# Patient Record
Sex: Male | Born: 1950
Health system: Southern US, Community
[De-identification: ages and names within clinical notes are randomized; demographics above are authoritative.]

## PROBLEM LIST (undated history)

## (undated) DIAGNOSIS — E291 Testicular hypofunction: Secondary | ICD-10-CM

## (undated) DIAGNOSIS — R000 Tachycardia, unspecified: Secondary | ICD-10-CM

## (undated) DIAGNOSIS — I10 Essential (primary) hypertension: Secondary | ICD-10-CM

## (undated) DIAGNOSIS — N529 Male erectile dysfunction, unspecified: Secondary | ICD-10-CM

## (undated) DIAGNOSIS — N400 Enlarged prostate without lower urinary tract symptoms: Secondary | ICD-10-CM

## (undated) DIAGNOSIS — R7301 Impaired fasting glucose: Secondary | ICD-10-CM

## (undated) DIAGNOSIS — H353190 Nonexudative age-related macular degeneration, unspecified eye, stage unspecified: Secondary | ICD-10-CM

## (undated) HISTORY — DX: Testicular hypofunction: E29.1

## (undated) HISTORY — DX: Male erectile dysfunction, unspecified: N52.9

## (undated) HISTORY — DX: Benign prostatic hyperplasia without lower urinary tract symptoms: N40.0

## (undated) HISTORY — DX: Essential (primary) hypertension: I10

## (undated) HISTORY — PX: ANKLE SURGERY: SHX546

## (undated) HISTORY — PX: FOOT SURGERY: SHX648

## (undated) HISTORY — DX: Impaired fasting glucose: R73.01

## (undated) HISTORY — DX: Nonexudative age-related macular degeneration, unspecified eye, stage unspecified: H35.3190

## (undated) HISTORY — DX: Tachycardia, unspecified: R00.0

## (undated) HISTORY — PX: TONSILLECTOMY: SHX5217

## (undated) HISTORY — PX: VASECTOMY: SHX75

---

## 1999-11-27 HISTORY — PX: ARTHROSCOPIC REPAIR ACL: SUR80

## 2004-11-26 HISTORY — PX: ABLATION OF DYSRHYTHMIC FOCUS: SHX254

## 2007-11-27 LAB — HM COLONOSCOPY

## 2009-11-26 DIAGNOSIS — H353190 Nonexudative age-related macular degeneration, unspecified eye, stage unspecified: Secondary | ICD-10-CM

## 2009-11-26 HISTORY — DX: Nonexudative age-related macular degeneration, unspecified eye, stage unspecified: H35.3190

## 2012-01-02 ENCOUNTER — Ambulatory Visit (INDEPENDENT_AMBULATORY_CARE_PROVIDER_SITE_OTHER): Payer: BC Managed Care – PPO | Admitting: Family Medicine

## 2012-01-02 ENCOUNTER — Encounter: Payer: Self-pay | Admitting: Family Medicine

## 2012-01-02 VITALS — BP 102/74 | HR 60 | Ht 73.0 in | Wt 235.0 lb

## 2012-01-02 DIAGNOSIS — N400 Enlarged prostate without lower urinary tract symptoms: Secondary | ICD-10-CM

## 2012-01-02 DIAGNOSIS — R7301 Impaired fasting glucose: Secondary | ICD-10-CM

## 2012-01-02 DIAGNOSIS — Z125 Encounter for screening for malignant neoplasm of prostate: Secondary | ICD-10-CM

## 2012-01-02 DIAGNOSIS — Z23 Encounter for immunization: Secondary | ICD-10-CM

## 2012-01-02 DIAGNOSIS — R5381 Other malaise: Secondary | ICD-10-CM

## 2012-01-02 DIAGNOSIS — Z0000 Encounter for general adult medical examination without abnormal findings: Secondary | ICD-10-CM

## 2012-01-02 DIAGNOSIS — E785 Hyperlipidemia, unspecified: Secondary | ICD-10-CM

## 2012-01-02 DIAGNOSIS — I10 Essential (primary) hypertension: Secondary | ICD-10-CM

## 2012-01-02 DIAGNOSIS — H353190 Nonexudative age-related macular degeneration, unspecified eye, stage unspecified: Secondary | ICD-10-CM

## 2012-01-02 LAB — POCT URINALYSIS DIPSTICK
Leukocytes, UA: NEGATIVE
Nitrite, UA: NEGATIVE
Protein, UA: NEGATIVE
Spec Grav, UA: 1.02
Urobilinogen, UA: NEGATIVE

## 2012-01-02 MED ORDER — TAMSULOSIN HCL 0.4 MG PO CAPS: 0.4000 mg | ORAL_CAPSULE | Freq: Every day | ORAL | Status: DC

## 2012-01-02 NOTE — Progress Notes (Signed)
Dillon Caldwell is a 61 y.o. male who presents for a complete physical.  He has the following concerns: He would like prescription for Flomax. He took it for a long time, then changed to [place], but has been off since August. Haynes Bast is expensive, and wasn't significantly better than the Flomax.  Currently gets up to void once a night only once a week.  Has occasional urinary hesitancy (took a long time to void giving sample today), and some decreased stream and doesn't feel that bladder emptying is as good as when he was on medications.  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  He was followed by retinal specialist in [place], was due for f/u this past August.  Hypertension follow-up.  Has been on medications for 12 years, and has been on Cozaar.  He was out of medications for about a month and BP remained 120/80, and once 104/68 when he donated blood, off medication. Has been back on Cozaar for about a month. Denies any dizziness, headaches, chest pain.  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  Health Maintenance:  There is no immunization history on file for this patient. Patient can't recall last tetanus shot, has been "many years" Flu shot at grocery store in November 2012 Last colonoscopy: 2008 Last PSA: yearly with previous doctor in [place] Dentist: due--needs name of local dentist Ophtho: regularly Exercise: 4 days/week, and walks dogs 20 minutes daily.  Past Medical History  Diagnosis Date  . Essential hypertension, benign     diagnosed age 30  . BPH (benign prostatic hyperplasia)   . Macular degeneration, dry 2011    followed by retinal specialist  . Tachycardia     likely h/o SVT, treated with ablation   . Erectile dysfunction     previously took Cialis    Past Surgical History  Procedure Date  . Ablation of dysrhythmic focus 2006    ?PSVT by history  . Ankle surgery     both ankles with bone spurs (related to [place])   . Arthroscopic repair acl 2001    L knee  . Vasectomy   . Tonsillectomy child    History   Social History  . Marital Status: Married    Spouse Name: N/A    Number of Children: 2  . Years of Education: N/A   Occupational History  . retired Runner, broadcasting/film/video    Social History Main Topics  . Smoking status: Never Smoker   . Smokeless tobacco: Never Used  . Alcohol Use: Yes     10 beers/week and occasional martini (at least 1 drink daily)  . Drug Use: No  . Sexually Active: Yes -- Male partner(s)   Other Topics Concern  . Not on file   Social History Narrative   Lives with wife, 2 dogs and outside cat.  Son lives in [place], daughter in [place], [place]    Family History  Problem Relation Age of Onset  . Diabetes Mother   . Heart disease Mother     angioplasty in her 82's  . Pulmonary fibrosis Mother   . Hypertension Mother   . Macular degeneration Mother   . Stroke Brother 48  . Crohn's disease Son     dx'd at age 25 months  . Macular degeneration Maternal Aunt   . Diabetes Maternal Aunt   . Macular degeneration Maternal Uncle   . Diabetes Maternal Uncle   . Cancer Maternal Uncle  prostate cancer  . Cancer Maternal Grandmother 8    colon cancer  . Macular degeneration Maternal Aunt   . Diabetes Maternal Aunt    Current outpatient prescriptions:losartan (COZAAR) 100 MG tablet, Take 100 mg by mouth daily., Disp: , Rfl: ;  Multiple Vitamins-Minerals (PRESERVISION AREDS 2 PO), Take 2 capsules by mouth daily., Disp: , Rfl: ;  Tamsulosin HCl (FLOMAX) 0.4 MG CAPS, Take 1 capsule (0.4 mg total) by mouth daily., Disp: 30 capsule, Rfl: 11;  vitamin C (ASCORBIC ACID) 500 MG tablet, Take 500 mg by mouth daily., Disp: , Rfl:   No Known Allergies  ROS: The patient denies anorexia, fever, weight changes, headaches,  vision loss, ear pain, hoarseness, chest pain, palpitations, dizziness, syncope, dyspnea on exertion, cough, swelling, nausea, vomiting, diarrhea, constipation, abdominal  pain, melena, hematochezia, indigestion/heartburn, hematuria, incontinence, dysuria, genital lesions, joint pains, numbness, tingling, weakness, tremor, suspicious skin lesions, depression, anxiety, abnormal bleeding/bruising, or enlarged lymph nodes +hearing loss--wears hearing aids. +erectile dysfunction (not a big issue now due to wife) +Loss of hair on both lower legs   PHYSICAL EXAM: BP 102/74  Pulse 60  Ht 6\' 1"  (1.854 m)  Wt 235 lb (106.595 kg)  BMI 31.00 kg/m2 General Appearance:    Alert, cooperative, no distress, appears stated age  Head:    Normocephalic, without obvious abnormality, atraumatic  Eyes:    PERRL, conjunctiva/corneas clear, EOM's intact, fundi    benign  Ears:    Normal TM's and external ear canals on left.  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, chronic)  Nose:   Nares normal, mucosa normal, no drainage or sinus   tenderness  Throat:   Lips, mucosa, and tongue normal; teeth and gums normal  Neck:   Supple, no lymphadenopathy;  thyroid:  no   enlargement/tenderness/nodules; no carotid   bruit or JVD  Back:    Spine nontender, no curvature, ROM normal, no CVA     tenderness  Lungs:     Clear to auscultation bilaterally without wheezes, rales or     ronchi; respirations unlabored  Chest Wall:    No tenderness or deformity   Heart:    Regular rate and rhythm, S1 and S2 normal, no murmur, rub   or gallop  Breast Exam:    No chest wall tenderness, masses or gynecomastia  Abdomen:     Soft, non-tender, nondistended, normoactive bowel sounds,    no masses, no hepatosplenomegaly  Genitalia:    Normal male external genitalia without lesions.  Testicles without masses.  No inguinal hernias.  Rectal:    Normal sphincter tone, no masses or tenderness; guaiac negative stool.  Prostate smooth, no nodules, mildly enlarged.  Extremities:   No clubbing, cyanosis or edema  Pulses:   2+ and symmetric all extremities  Skin:   Skin color, texture, turgor normal, no  rashes or lesions.  Decreased hair on lower extremities bilaterally  Lymph nodes:   Cervical, supraclavicular, and axillary nodes normal  Neurologic:   CNII-XII intact, normal strength, sensation and gait; reflexes 2+ and symmetric throughout          Psych:   Normal mood, affect, hygiene and grooming.     ASSESSMENT/PLAN: 1. Routine general medical examination at a health care facility  Visual acuity screening, POCT Urinalysis Dipstick  2. Other malaise and fatigue  CBC with Differential, TSH  3. Special screening for malignant neoplasm of prostate  PSA  4. Dyslipidemia  Lipid panel  5. Essential hypertension, benign  Comprehensive metabolic panel  6. Impaired fasting glucose  Hemoglobin A1c  7. BPH (benign prostatic hyperplasia)  Tamsulosin HCl (FLOMAX) 0.4 MG CAPS  8. Need for Tdap vaccination  Tdap vaccine greater than or equal to 7yo IM   HTN--trial cutting Cozaar back to 1/2 tablet, monitor blood pressures at home.  Goal <130/80  BPH--restart Flomax 0.4mg   Macular degeneration--refer to retinal specialist  Discussed PSA screening (risks/benefits) and he wishes to proceed with testing: recommended at least 30 minutes of aerobic activity at least 5 days/week; proper sunscreen use reviewed; healthy diet and alcohol recommendations (less than or equal to 2 drinks/day) reviewed; regular seatbelt use; changing batteries in smoke detectors. Self-testicular exams. Immunization recommendations discussed.  Colonoscopy recommendations reviewed--likely due again this year.  Will review old records.  Check old records for immunizations.  Give tdap today. Zostavax recommended--to check with insurance Review old records to see when next colonoscopy is due--likely this year.

## 2012-01-02 NOTE — Progress Notes (Signed)
Addended by: Joselyn Arrow on: 01/02/2012 08:53 PM   Modules accepted: Orders

## 2012-01-02 NOTE — Patient Instructions (Addendum)
HEALTH MAINTENANCE RECOMMENDATIONS:  It is recommended that you get at least 30 minutes of aerobic exercise at least 5 days/week (for weight loss, you may need as much as 60-90 minutes). This can be any activity that gets your heart rate up. This can be divided in 10-15 minute intervals if needed, but try and build up your endurance at least once a week.  Weight bearing exercise is also recommended twice weekly.  Eat a healthy diet with lots of vegetables, fruits and fiber.  "Colorful" foods have a lot of vitamins (ie green vegetables, tomatoes, red peppers, etc).  Limit sweet tea, regular sodas and alcoholic beverages, all of which has a lot of calories and sugar.  Up to 2 alcoholic drinks daily may be beneficial for men (unless trying to lose weight, watch sugars).  Drink a lot of water.  Sunscreen of at least SPF 30 should be used on all sun-exposed parts of the skin when outside between the hours of 10 am and 4 pm (not just when at beach or pool, but even with exercise, golf, tennis, and yard work!)  Use a sunscreen that says "broad spectrum" so it covers both UVA and UVB rays, and make sure to reapply every 1-2 hours.  Remember to change the batteries in your smoke detectors when changing your clock times in the spring and fall.  Use your seat belt every time you are in a car, and please drive safely and not be distracted with cell phones and texting while driving.   High blood pressure--trial cutting Cozaar back to 1/2 tablet, monitor blood pressures at home.  Goal <130/80. If BP remains at goal, you can call for change in Rx to 50 mg tablet.  BPH--restart Flomax.  Check with your insurance regarding shingles vaccine coverage (Zostavax). Schedule nurse visit to get the vaccine.  Dentists I recommend: Dr. Kathlene November [facility] near [facility]) Dr. Rocco Pauls [facility] near [facility])  Weight loss is encouraged.  Increase your exercise, cut back on carbs, alcohol  and juices or other beverages that contain calories.

## 2012-01-03 ENCOUNTER — Other Ambulatory Visit: Payer: BC Managed Care – PPO

## 2012-01-03 DIAGNOSIS — R5381 Other malaise: Secondary | ICD-10-CM

## 2012-01-03 DIAGNOSIS — Z125 Encounter for screening for malignant neoplasm of prostate: Secondary | ICD-10-CM

## 2012-01-03 DIAGNOSIS — E785 Hyperlipidemia, unspecified: Secondary | ICD-10-CM

## 2012-01-03 DIAGNOSIS — I10 Essential (primary) hypertension: Secondary | ICD-10-CM

## 2012-01-03 DIAGNOSIS — R7301 Impaired fasting glucose: Secondary | ICD-10-CM

## 2012-01-03 LAB — CBC WITH DIFFERENTIAL/PLATELET
Basophils Relative: 1 % (ref 0–1)
Eosinophils Absolute: 0.3 10*3/uL (ref 0.0–0.7)
Lymphs Abs: 1.1 10*3/uL (ref 0.7–4.0)
MCH: 30.4 pg (ref 26.0–34.0)
Neutrophils Relative %: 58 % (ref 43–77)
Platelets: 186 10*3/uL (ref 150–400)
RBC: 4.54 MIL/uL (ref 4.22–5.81)

## 2012-01-03 LAB — COMPREHENSIVE METABOLIC PANEL
ALT: 22 U/L (ref 0–53)
AST: 27 U/L (ref 0–37)
Alkaline Phosphatase: 67 U/L (ref 39–117)
Glucose, Bld: 110 mg/dL — ABNORMAL HIGH (ref 70–99)
Sodium: 137 mEq/L (ref 135–145)
Total Bilirubin: 0.5 mg/dL (ref 0.3–1.2)
Total Protein: 6.3 g/dL (ref 6.0–8.3)

## 2012-01-03 LAB — LIPID PANEL
LDL Cholesterol: 121 mg/dL — ABNORMAL HIGH (ref 0–99)
Triglycerides: 99 mg/dL (ref <150)
VLDL: 20 mg/dL (ref 0–40)

## 2012-01-04 LAB — PSA: PSA: 1.21 ng/mL (ref <=4.00)

## 2012-01-04 LAB — TSH: TSH: 3.431 u[IU]/mL (ref 0.350–4.500)

## 2012-01-04 LAB — HEMOGLOBIN A1C
Hgb A1c MFr Bld: 6 % — ABNORMAL HIGH (ref <5.7)
Mean Plasma Glucose: 126 mg/dL — ABNORMAL HIGH (ref <117)

## 2012-01-14 ENCOUNTER — Encounter: Payer: Self-pay | Admitting: Family Medicine

## 2012-01-14 ENCOUNTER — Ambulatory Visit (INDEPENDENT_AMBULATORY_CARE_PROVIDER_SITE_OTHER): Payer: BC Managed Care – PPO | Admitting: Family Medicine

## 2012-01-14 VITALS — BP 112/74 | HR 84 | Temp 98.8°F | Ht 73.0 in | Wt 234.0 lb

## 2012-01-14 DIAGNOSIS — J069 Acute upper respiratory infection, unspecified: Secondary | ICD-10-CM

## 2012-01-14 NOTE — Progress Notes (Signed)
Chief complaint: deep hacking cough and body aches x 8 days  HPI:  Began 8 days ago with some achiness, chills, then developed cough.  Had some postnasal drip.  Only mild head congestion.  Feels like it is mostly in his chest.  Cough is nonproductive. Denies shortness of breath.  Felt warm at times, and had some chills earlier last week.  Has some intermittent, "fleeting" headaches posteriorly.  Denies sinus headaches.  Slight sore throat, scratchy.  Denies ear pain, pressure.  Took OTC multi-symptoms cough and cold medicine with some improvement.  Symptoms aren't getting worse, just hadn't been getting better.  Today it feels like the stuff in his chest is loosening up a bit, not as dense as it has been.     Past Medical History  Diagnosis Date  . Essential hypertension, benign     diagnosed age 61  . BPH (benign prostatic hyperplasia)   . Macular degeneration, dry 2011    followed by retinal specialist  . Tachycardia     likely h/o SVT, treated with ablation   . Erectile dysfunction     previously took Cialis    Past Surgical History  Procedure Date  . Ablation of dysrhythmic focus 2006    ?PSVT by history  . Ankle surgery     both ankles with bone spurs (related to [place])  . Arthroscopic repair acl 2001    L knee  . Vasectomy   . Tonsillectomy child    History   Social History  . Marital Status: Married    Spouse Name: N/A    Number of Children: 2  . Years of Education: N/A   Occupational History  . retired Runner, broadcasting/film/video    Social History Main Topics  . Smoking status: Never Smoker   . Smokeless tobacco: Never Used  . Alcohol Use: Yes     10 beers/week and occasional martini (at least 1 drink daily)  . Drug Use: No  . Sexually Active: Yes -- Male partner(s)   Other Topics Concern  . Not on file   Social History Narrative   Lives with wife, 2 dogs and outside cat.  Son lives in [place], daughter in [place], [place]    Family History  Problem Relation Age of Onset  .  Diabetes Mother   . Heart disease Mother     angioplasty in her 65's  . Pulmonary fibrosis Mother   . Hypertension Mother   . Macular degeneration Mother   . Stroke Brother 48  . Crohn's disease Son     dx'd at age 98 months  . Macular degeneration Maternal Aunt   . Diabetes Maternal Aunt   . Macular degeneration Maternal Uncle   . Diabetes Maternal Uncle   . Cancer Maternal Uncle     prostate cancer  . Cancer Maternal Grandmother 52    colon cancer  . Macular degeneration Maternal Aunt   . Diabetes Maternal Aunt     Current outpatient prescriptions:acetaminophen (TYLENOL) 500 MG tablet, Take 1,000 mg by mouth 2 (two) times daily., Disp: , Rfl: ;  losartan (COZAAR) 100 MG tablet, Take 100 mg by mouth daily., Disp: , Rfl: ;  Multiple Vitamins-Minerals (PRESERVISION AREDS 2 PO), Take 2 capsules by mouth daily., Disp: , Rfl: ;  Omega-3 Fatty Acids (FISH OIL) 1000 MG CAPS, Take 1 capsule by mouth daily., Disp: , Rfl:  Tamsulosin HCl (FLOMAX) 0.4 MG CAPS, Take 1 capsule (0.4 mg total) by mouth daily., Disp: 30 capsule, Rfl: 11;  vitamin C (ASCORBIC ACID) 500 MG tablet, Take 500 mg by mouth daily., Disp: , Rfl:   No Known Allergies  ROS:  Denies headaches, chest pain, palpitations, shortness of breath, GI or GU complaints, skin rash.  See HPI  PHYSICAL EXAM: BP 112/74  Pulse 84  Temp(Src) 98.8 F (37.1 C) (Oral)  Ht 6\' 1"  (1.854 m)  Wt 234 lb (106.142 kg)  BMI 30.87 kg/m2  Well developed, pleasant male, in no distress, occasional cough. HEENT:  PERRL, EOMI, conjunctiva clear.  TM's and EAC's normal, wearing hearing aids.  Nasal mucosa is mild-mod edematous, nonerythematous.  Some yellow crust L nares, clear on R.  Sinuses nontender.  OP is clear except for some cobblestoning posteriorly. Neck: no lymphadenopathy Heart: regular rate and rhythm without murmur Lungs: clear bilaterally with good air movement.  No wheezes, rales or ronchi Skin: no rashes Psych: normal mood, affect,  hygiene and grooming  ASSESSMENT/PLAN: 1. URI (upper respiratory infection)     Supportive measures were reviewed.  Recommended antihistamine (claritin, zyrtec or coricidin HBP); avoid decongestants. Guaifenesin is recommended to loosen phlegm (ie Mucinex plain or DM, or Robitussin products)  Call if symptoms persist or worsen over the next week for antibiotics

## 2012-01-14 NOTE — Patient Instructions (Signed)
Upper respiratory infection--Supportive measures were reviewed.  Recommended antihistamine (claritin, zyrtec or coricidin HBP); avoid decongestants. Guaifenesin is recommended to loosen phlegm (ie Mucinex plain or DM, or Robitussin products)  Call if symptoms persist or worsen over the next week for antibiotics

## 2012-01-16 ENCOUNTER — Encounter (INDEPENDENT_AMBULATORY_CARE_PROVIDER_SITE_OTHER): Payer: BC Managed Care – PPO | Admitting: Ophthalmology

## 2012-01-16 ENCOUNTER — Telehealth: Payer: Self-pay | Admitting: Family Medicine

## 2012-01-16 DIAGNOSIS — H3530 Unspecified macular degeneration: Secondary | ICD-10-CM

## 2012-01-16 DIAGNOSIS — H3553 Other dystrophies primarily involving the sensory retina: Secondary | ICD-10-CM

## 2012-01-16 DIAGNOSIS — H2510 Age-related nuclear cataract, unspecified eye: Secondary | ICD-10-CM

## 2012-01-16 DIAGNOSIS — H43819 Vitreous degeneration, unspecified eye: Secondary | ICD-10-CM

## 2012-01-16 MED ORDER — AZITHROMYCIN 250 MG PO TABS: ORAL_TABLET | ORAL | Status: AC

## 2012-01-16 NOTE — Telephone Encounter (Signed)
Called patient and let him know rx for zpack sent to pharmacy.

## 2012-01-16 NOTE — Telephone Encounter (Signed)
Advise pt--z-pak sent to pharmacy 

## 2012-09-08 ENCOUNTER — Encounter: Payer: Self-pay | Admitting: Family Medicine

## 2012-09-08 ENCOUNTER — Ambulatory Visit (INDEPENDENT_AMBULATORY_CARE_PROVIDER_SITE_OTHER): Payer: BC Managed Care – PPO | Admitting: Family Medicine

## 2012-09-08 VITALS — BP 104/70 | HR 60 | Ht 73.0 in | Wt 220.0 lb

## 2012-09-08 DIAGNOSIS — R7301 Impaired fasting glucose: Secondary | ICD-10-CM

## 2012-09-08 DIAGNOSIS — I10 Essential (primary) hypertension: Secondary | ICD-10-CM

## 2012-09-08 DIAGNOSIS — Z23 Encounter for immunization: Secondary | ICD-10-CM

## 2012-09-08 DIAGNOSIS — E785 Hyperlipidemia, unspecified: Secondary | ICD-10-CM

## 2012-09-08 LAB — POCT GLYCOSYLATED HEMOGLOBIN (HGB A1C): Hemoglobin A1C: 5.7

## 2012-09-08 NOTE — Progress Notes (Signed)
Chief Complaint  Patient presents with  . Follow-up    fasting follow up. Would like Zostavax-was partially counseled at [facility], thinks his insurance will pay, did call and after 30 minutes of getting nowhere he hung up.   HPI:  Patient presents for f/u on glucose, cholesterol, and blood pressure.  He lost 14 pounds since his last visit in February.  He has been exercising longer (43 minutes on treadmill, instead of 20), and eating [employer] with additional vegetables at least 5x/week for dinner, burrito bowls at [employer] for lunch.  HTN--has been off of cozaar for about 2 weeks (ran out).  Denies having any dizziness prior to stopping. Denies headaches.  Has lost weight, and BP is still good--wondering if he needs to restart medication or not.  Zostavax--his son will be staying with him for a month, coming soon, on Rem[employer] for Crohn's.  Prefers to wait until after son leaves to return for vaccine.  Past Medical History  Diagnosis Date  . Essential hypertension, benign     diagnosed age 49  . BPH (benign prostatic hyperplasia)   . Macular degeneration, dry 2011    followed by retinal specialist  . Tachycardia     likely h/o SVT, treated with ablation   . Erectile dysfunction     previously took Cialis   Past Surgical History  Procedure Date  . Ablation of dysrhythmic focus 2006    ?PSVT by history  . Ankle surgery     both ankles with bone spurs (related to [place])  . Arthroscopic repair acl 2001    L knee  . Vasectomy   . Tonsillectomy child   History   Social History  . Marital Status: Married    Spouse Name: N/A    Number of Children: 2  . Years of Education: N/A   Occupational History  . retired Runner, broadcasting/film/video    Social History Main Topics  . Smoking status: Never Smoker   . Smokeless tobacco: Never Used  . Alcohol Use: Yes     maybe 3 beers per week.  . Drug Use: No  . Sexually Active: Yes -- Male partner(s)   Other Topics Concern  . Not on file   Social History  Narrative   Lives with wife, 2 dogs and outside cat.  Son lives in [place], daughter in [place], teaching at [employer].   Current outpatient prescriptions:acetaminophen (TYLENOL) 500 MG tablet, Take 1,000 mg by mouth at bedtime. , Disp: , Rfl: ;  Multiple Vitamins-Minerals (PRESERVISION AREDS 2 PO), Take 2 capsules by mouth daily., Disp: , Rfl: ;  Omega-3 Fatty Acids (FISH OIL) 1000 MG CAPS, Take 1 capsule by mouth daily., Disp: , Rfl: ;  Tamsulosin HCl (FLOMAX) 0.4 MG CAPS, Take 1 capsule (0.4 mg total) by mouth daily., Disp: 30 capsule, Rfl: 11 vitamin C (ASCORBIC ACID) 500 MG tablet, Take 500 mg by mouth daily., Disp: , Rfl: ;  losartan (COZAAR) 100 MG tablet, Take 100 mg by mouth daily., Disp: , Rfl:   No Known Allergies  ROS:  Denies headaches, dizziness, chest pain, palpitations, shortness of breath, fevers, URI symptoms, GI complaints, skin rash or other concerns.  Denies edema.  PHYSICAL EXAM: BP 104/70  Pulse 60  Ht 6\' 1"  (1.854 m)  Wt 220 lb (99.791 kg)  BMI 29.03 kg/m2 112/68 on repeat by MD, RA Well developed, pleasant male in no distress Neck: no lymphadenopathy or mass Heart: regular rate and rhythm Lungs: clear bilaterally Abdomen: soft, nontender, no mass  Extremities: no edema Psych: normal mood, affect, hygiene and grooming Neuro: alert and oriented. Normal gait, cranial nerves grossly intact  Lab Results  Component Value Date   HGBA1C 5.7 09/08/2012   ASSESSMENT/PLAN:  1. Dyslipidemia  Lipid panel  2. Need for prophylactic vaccination and inoculation against influenza  Flu vaccine greater than or equal to 3yo preservative free IM  3. Impaired fasting glucose  HgB A1c, Glucose, random  4. Essential hypertension, benign     HTN--okay to remain off Cozaar.  Check BP at least once a week.  If BP>135/85, then restart cozaar at 1/2 tablet daily.  Impaired fasting glucose--A1c improved.  Continue weight loss, dietary changes, and daily exercise  Dyslipidemia--low HDL,  elevated ratio, and borderline LDL.  Recheck today.  Consider doing [employer] in future  Return for nurse visit for zostavax after his son leaves.  Flu shot given today.

## 2012-09-08 NOTE — Patient Instructions (Addendum)
okay to remain off Cozaar.  Check BP at least once a week.  If BP>135/85, then restart cozaar at 1/2 tablet daily.  Continue weight loss, dietary changes, and daily exercise

## 2012-09-09 LAB — LIPID PANEL
Total CHOL/HDL Ratio: 5.3 Ratio
VLDL: 19 mg/dL (ref 0–40)

## 2012-09-09 LAB — GLUCOSE, RANDOM: Glucose, Bld: 111 mg/dL — ABNORMAL HIGH (ref 70–99)

## 2012-12-01 ENCOUNTER — Encounter: Payer: Self-pay | Admitting: Family Medicine

## 2012-12-01 ENCOUNTER — Ambulatory Visit (INDEPENDENT_AMBULATORY_CARE_PROVIDER_SITE_OTHER): Payer: 59 | Admitting: Family Medicine

## 2012-12-01 VITALS — BP 128/82 | HR 56 | Ht 73.0 in | Wt 228.0 lb

## 2012-12-01 DIAGNOSIS — Z125 Encounter for screening for malignant neoplasm of prostate: Secondary | ICD-10-CM

## 2012-12-01 DIAGNOSIS — E785 Hyperlipidemia, unspecified: Secondary | ICD-10-CM

## 2012-12-01 DIAGNOSIS — I10 Essential (primary) hypertension: Secondary | ICD-10-CM

## 2012-12-01 DIAGNOSIS — Z23 Encounter for immunization: Secondary | ICD-10-CM

## 2012-12-01 DIAGNOSIS — Z0000 Encounter for general adult medical examination without abnormal findings: Secondary | ICD-10-CM

## 2012-12-01 DIAGNOSIS — N400 Enlarged prostate without lower urinary tract symptoms: Secondary | ICD-10-CM

## 2012-12-01 DIAGNOSIS — N529 Male erectile dysfunction, unspecified: Secondary | ICD-10-CM

## 2012-12-01 LAB — POCT URINALYSIS DIPSTICK
Nitrite, UA: NEGATIVE
Urobilinogen, UA: NEGATIVE
pH, UA: 5

## 2012-12-01 MED ORDER — TADALAFIL 20 MG PO TABS: 10.0000 mg | ORAL_TABLET | ORAL | Status: DC | PRN

## 2012-12-01 MED ORDER — TAMSULOSIN HCL 0.4 MG PO CAPS: 0.4000 mg | ORAL_CAPSULE | Freq: Every day | ORAL | Status: DC

## 2012-12-01 NOTE — Progress Notes (Signed)
Chief Complaint  Patient presents with  . Annual Exam    nonfasting (had Cliff bar at 7:30am banana and 2 pieces of raisin toast at noon)  UA showed trace blood, pt stated that he is asymptomatic. He does think he may have a bone spur in his right ankle, thinks he may need new orthodics.   Dillon Caldwell is a 62 y.o. male who presents for a complete physical.  He has the following concerns:  He stopped taking BP meds in September or October, and 4 weeks later BP remained low at 100/60 so he remained off medication.  He hasn't checked his blood pressure since then.  Denies headaches, dizziness, chest pain.  BPH:  Doing well on Flomax--stream remains stronger, and only occasionally gets up at night.  Needs refill today.  Dyslipidemia--started taking a no-flush niacin, and increased fish oil dose.  Hasn't return for [facility] study to evaluate particle size yet. He reports always having low HDL, even when very active, running marathons.  R lateral ankle pain--shooting pains intermittently with certain steps, better just a few steps later.  Has h/o bone spurs.  Former Radio producer.  Had surgery to remove bone spur from R, and filed down on the right.  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  Pain occurs only once every 3 months.  Complaining of some right shoulder pain--feels it popping.  Sometimes feels like it is going to "pop out of joint"; pops and painful when he rolls on it at night sometimes.  H/o injury in past (played rugby).  Health Maintenance: Immunization History  Administered Date(s) Administered  . Influenza Split 09/08/2012  . Tdap 01/02/2012   Last colonoscopy: 2008, believes he was told every 10 years (has had 2 colonoscopies, thinking he started age 39-46) Last PSA: 12/2011 Dentist: Dr. Helmut Muster, UTD Ophtho: March 2013 Exercise: 3-4 days/week, and walks dogs 40 minutes daily, pilates 2x/week.  Past Medical History  Diagnosis Date  . Essential hypertension, benign     diagnosed age  55  . BPH (benign prostatic hyperplasia)   . Macular degeneration, dry 2011    followed by retinal specialist (dry)  . Tachycardia     likely h/o SVT, treated with ablation   . Erectile dysfunction     previously took Cialis    Past Surgical History  Procedure Date  . Ablation of dysrhythmic focus 2006    ?PSVT by history  . Ankle surgery     both ankles with bone spurs (related to [place])  . Arthroscopic repair acl 2001    L knee  . Vasectomy   . Tonsillectomy child    History   Social History  . Marital Status: Married    Spouse Name: N/A    Number of Children: 2  . Years of Education: N/A   Occupational History  . retired Runner, broadcasting/film/video    Social History Main Topics  . Smoking status: Never Smoker   . Smokeless tobacco: Never Used  . Alcohol Use: Yes     Comment: maybe 4 per week.  . Drug Use: No  . Sexually Active: Yes -- Male partner(s)   Other Topics Concern  . Not on file   Social History Narrative   Lives with wife, 2 dogs and outside cat.  Son lives in [place], daughter in [place], teaching at [employer]. 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 Diabetes Mother   .  Heart disease Mother     angioplasty in her 42's  . Pulmonary fibrosis Mother   . Hypertension Mother   . Macular degeneration Mother   . Stroke Brother 48  . Crohn's disease Son     dx'd at age 73 months  . Macular degeneration Maternal Aunt   . Diabetes Maternal Aunt   . Macular degeneration Maternal Uncle   . Diabetes Maternal Uncle   . Cancer Maternal Uncle     prostate cancer  . Cancer Maternal Grandmother 42    colon cancer  . Macular degeneration Maternal Aunt   . Diabetes Maternal Aunt     Current outpatient prescriptions:Ibuprofen-Diphenhydramine Cit (ADVIL PM PO), Take 2 tablets by mouth at bedtime as needed., Disp: , Rfl: ;  Multiple Vitamins-Minerals (PRESERVISION AREDS 2 PO), Take 2 capsules by  mouth daily., Disp: , Rfl: ;  niacin 500 MG tablet, Take 500 mg by mouth at bedtime., Disp: , Rfl: ;  Omega-3 Fatty Acids (FISH OIL) 1000 MG CAPS, Take 1 capsule by mouth daily., Disp: , Rfl:  Tamsulosin HCl (FLOMAX) 0.4 MG CAPS, Take 1 capsule (0.4 mg total) by mouth daily., Disp: 30 capsule, Rfl: 11;  vitamin C (ASCORBIC ACID) 500 MG tablet, Take 500 mg by mouth daily., Disp: , Rfl: ;  losartan (COZAAR) 100 MG tablet, Take 100 mg by mouth daily., Disp: , Rfl: ;  tadalafil (CIALIS) 20 MG tablet, Take 0.5-1 tablets (10-20 mg total) by mouth every other day as needed for erectile dysfunction., Disp: 15 tablet, Rfl: 11  No Known Allergies  ROS: The patient denies anorexia, fever, weight changes, headaches, vision loss, ear pain, hoarseness, chest pain, dizziness, syncope, dyspnea on exertion, cough, swelling, nausea, vomiting, diarrhea, constipation, abdominal pain, melena, hematochezia, indigestion/heartburn, hematuria, incontinence, dysuria, genital lesions, joint pains, numbness, tingling, weakness, tremor, suspicious skin lesions, depression, anxiety, abnormal bleeding/bruising, or enlarged lymph nodes. Rare palpitations (noted 3 hard beats last night). +hearing loss--wears hearing aids. Tinnitus bilaterally (chronic) +erectile dysfunction.  Tried cialis in the past, only slight improvement +Loss of hair on both lower legs, although notes that his knee hair has gotten "furrier" R shoulder pain per HPI, ankle pain per HPI  PHYSICAL EXAM: BP 130/90  Pulse 56  Ht 6\' 1"  (1.854 m)  Wt 228 lb (103.42 kg)  BMI 30.08 kg/m2 128/82 on repeat by MD, RA General Appearance:  Alert, cooperative, no distress, appears stated age   Head:  Normocephalic, without obvious abnormality, atraumatic   Eyes:  PERRL, conjunctiva/corneas clear, EOM's intact, fundi  benign   Ears:  Wearing hearing aids bilaterally  Nose:  Nares normal, mucosa normal, no drainage or sinus tenderness   Throat:  Lips, mucosa, and tongue  normal; teeth and gums normal   Neck:  Supple, no lymphadenopathy; thyroid: no enlargement/tenderness/nodules; no carotid  bruit or JVD   Back:  Spine nontender, no curvature, ROM normal, no CVA tenderness   Lungs:  Clear to auscultation bilaterally without wheezes, rales or ronchi; respirations unlabored   Chest Wall:  No tenderness or deformity   Heart:  Regular rate and rhythm, S1 and S2 normal, no murmur, rub  or gallop   Breast Exam:  No chest wall tenderness, masses or gynecomastia   Abdomen:  Soft, non-tender, nondistended, normoactive bowel sounds,  no masses, no hepatosplenomegaly   Genitalia:  Normal male external genitalia without lesions. Testicles without masses. No inguinal hernias.   Rectal:  Normal sphincter tone, no masses or tenderness; guaiac negative stool. Prostate smooth,  no nodules, not enlarged.   Extremities:  No clubbing, cyanosis or edema.  No ankle effusion, warmth.  Crepitus at R shoulder, with "popping" sound noted when testing supraspinatous against resistance.  No pain with ROM, strength normal  Pulses:  2+ and symmetric all extremities   Skin:  Skin color, texture, turgor normal, no rashes or lesions. Decreased hair on lower extremities bilaterally   Lymph nodes:  Cervical, supraclavicular, and axillary nodes normal   Neurologic:  CNII-XII intact, normal strength, sensation and gait; reflexes 2+ and symmetric throughout   Psych: Normal mood, affect, hygiene and grooming.   ASSESSMENT/PLAN:  1. Routine general medical examination at a health care facility  POCT Urinalysis Dipstick, Visual acuity screening  2. Essential hypertension, benign  Comprehensive metabolic panel  3. Special screening for malignant neoplasm of prostate  PSA  4. BPH (benign prostatic hyperplasia)  Tamsulosin HCl (FLOMAX) 0.4 MG CAPS  5. Dyslipidemia    6. Erectile dysfunction  tadalafil (CIALIS) 20 MG tablet  7. Need for shingles vaccine  Varicella-zoster vaccine subcutaneous   H/o  HTN--BP remains okay, okay to remain off medication, periodically check BP's at home.  Reviewed proper technique with wrist cuff, and can bring to next visit if he wants it verified.  PSA and c-met today (nonfasting, recently had lipids and glucose) (4-5 hours postprandial)  Return for [facility] Study to further evaluate dyslipidemia, particle size/number  zostavax today--risks side effects reviewed.  He hasn't checked coverage with his new insurance, but is willing to pay cost if not covered.  Recommended [facility] for new orthotics (and can evaluate ankle pain, x-ray if needed vs refer to ortho)  Shoulder pain--reviewed possible causes.  Consider week trial of NSAID if pain persists.

## 2012-12-01 NOTE — Patient Instructions (Signed)
HEALTH MAINTENANCE RECOMMENDATIONS:  It is recommended that you get at least 30 minutes of aerobic exercise at least 5 days/week (for weight loss, you may need as much as 60-90 minutes). This can be any activity that gets your heart rate up. This can be divided in 10-15 minute intervals if needed, but try and build up your endurance at least once a week.  Weight bearing exercise is also recommended twice weekly.  Eat a healthy diet with lots of vegetables, fruits and fiber.  "Colorful" foods have a lot of vitamins (ie green vegetables, tomatoes, red peppers, etc).  Limit sweet tea, regular sodas and alcoholic beverages, all of which has a lot of calories and sugar.  Up to 2 alcoholic drinks daily may be beneficial for men (unless trying to lose weight, watch sugars).  Drink a lot of water.  Sunscreen of at least SPF 30 should be used on all sun-exposed parts of the skin when outside between the hours of 10 am and 4 pm (not just when at beach or pool, but even with exercise, golf, tennis, and yard work!)  Use a sunscreen that says "broad spectrum" so it covers both UVA and UVB rays, and make sure to reapply every 1-2 hours.  Remember to change the batteries in your smoke detectors when changing your clock times in the spring and fall.  Use your seat belt every time you are in a car, and please drive safely and not be distracted with cell phones and texting while driving.  For new orthotics, I recommend [facility] (Dr. Charlsie Merles, [place])

## 2012-12-02 ENCOUNTER — Encounter: Payer: Self-pay | Admitting: Family Medicine

## 2012-12-02 LAB — COMPREHENSIVE METABOLIC PANEL
AST: 23 U/L (ref 0–37)
Alkaline Phosphatase: 84 U/L (ref 39–117)
BUN: 18 mg/dL (ref 6–23)
Creat: 0.91 mg/dL (ref 0.50–1.35)
Total Bilirubin: 0.6 mg/dL (ref 0.3–1.2)

## 2012-12-02 LAB — PSA: PSA: 1.36 ng/mL (ref <=4.00)

## 2013-01-15 ENCOUNTER — Ambulatory Visit (INDEPENDENT_AMBULATORY_CARE_PROVIDER_SITE_OTHER): Payer: BC Managed Care – PPO | Admitting: Ophthalmology

## 2013-01-29 ENCOUNTER — Ambulatory Visit (INDEPENDENT_AMBULATORY_CARE_PROVIDER_SITE_OTHER): Payer: 59 | Admitting: Ophthalmology

## 2013-01-29 DIAGNOSIS — H2510 Age-related nuclear cataract, unspecified eye: Secondary | ICD-10-CM

## 2013-01-29 DIAGNOSIS — H3530 Unspecified macular degeneration: Secondary | ICD-10-CM

## 2013-01-29 DIAGNOSIS — H43819 Vitreous degeneration, unspecified eye: Secondary | ICD-10-CM

## 2013-03-02 ENCOUNTER — Encounter: Payer: Self-pay | Admitting: Family Medicine

## 2013-03-03 ENCOUNTER — Other Ambulatory Visit: Payer: 59

## 2013-03-09 ENCOUNTER — Ambulatory Visit (INDEPENDENT_AMBULATORY_CARE_PROVIDER_SITE_OTHER): Payer: 59 | Admitting: Family Medicine

## 2013-03-09 ENCOUNTER — Encounter: Payer: Self-pay | Admitting: Family Medicine

## 2013-03-09 VITALS — BP 124/80 | HR 56 | Ht 73.0 in | Wt 233.0 lb

## 2013-03-09 DIAGNOSIS — K219 Gastro-esophageal reflux disease without esophagitis: Secondary | ICD-10-CM

## 2013-03-09 DIAGNOSIS — E785 Hyperlipidemia, unspecified: Secondary | ICD-10-CM

## 2013-03-09 DIAGNOSIS — I10 Essential (primary) hypertension: Secondary | ICD-10-CM | POA: Insufficient documentation

## 2013-03-09 DIAGNOSIS — R079 Chest pain, unspecified: Secondary | ICD-10-CM

## 2013-03-09 NOTE — Progress Notes (Signed)
Chief Complaint  Patient presents with  . Med check    fasting med check.   Patient presents for follow-up on dyslipidemia and blood pressure.  He has been off blood pressure medications since about October 2013. Hasn't been checking BP's much at home recently.  Denies headaches, dizziness, chest pain, shortness of breath.  He is complaining of some upper abdominal pain, with some mild heartburn--he has never had anything like this before.  Having some symptoms now, while fasting.  Unsure if related to particular foods.  Has made some changes in his diet, having more spicy foods.  Symptoms for about 4 weeks, come and go. Hasn't tried any OTC meds.  He has regained the weight that he lost. He does yoga or pilates 3x/week, and is walking further with the dogs (2 miles/day). [place] and plans longer walks or bike rides there now that weather is nicer.  He came in last week for the [facility] lipid panel--results aren't in yet.  Using fish oil (1 capsule daily), and the OTC no-flush Niacin daily.  Trying to follow low cholesterol diet.  Past Medical History  Diagnosis Date  . Essential hypertension, benign     diagnosed age 30; off meds 08/2012  . BPH (benign prostatic hyperplasia)   . Macular degeneration, dry 2011    followed by retinal specialist (dry)  . Tachycardia     likely h/o SVT, treated with ablation   . Erectile dysfunction     previously took Cialis  . Impaired fasting glucose     A1c 6.1 and fglu 127 01/2011  . Hypogonadism male     testosterone level 186 on 09/2010   Past Surgical History  Procedure Laterality Date  . Ablation of dysrhythmic focus  2006    ?PSVT by history  . Ankle surgery      both ankles with bone spurs (related to [place])  . Arthroscopic repair acl  2001    L knee  . Vasectomy    . Tonsillectomy  child   History   Social History  . Marital Status: Married    Spouse Name: N/A   Number of Children: 2  . Years of Education: N/A   Occupational History  . retired Runner, broadcasting/film/video    Social History Main Topics  . Smoking status: Never Smoker   . Smokeless tobacco: Never Used  . Alcohol Use: Yes     Comment: maybe 4 per week.  . Drug Use: No  . Sexually Active: Yes -- Male partner(s)   Other Topics Concern  . Not on file   Social History Narrative   Lives with wife, 2 dogs and outside cat.  Son lives in [place], daughter in [place], teaching at [employer]. 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 [employer]   Current outpatient prescriptions:acetaminophen (TYLENOL) 500 MG tablet, Take 500 mg by mouth as needed for pain., Disp: , Rfl: ;  Multiple Vitamins-Minerals (PRESERVISION AREDS 2 PO), Take 2 capsules by mouth daily., Disp: , Rfl: ;  niacin 500 MG tablet, Take 500 mg by mouth at bedtime., Disp: , Rfl: ;  Omega-3 Fatty Acids (FISH OIL) 1000 MG CAPS, Take 1 capsule by mouth daily., Disp: , Rfl:  tadalafil (CIALIS) 20 MG tablet, Take 0.5-1 tablets (10-20 mg total) by mouth every other day as needed for erectile dysfunction., Disp: 15 tablet, Rfl: 11;  Tamsulosin HCl (FLOMAX) 0.4 MG CAPS,  Take 1 capsule (0.4 mg total) by mouth daily., Disp: 30 capsule, Rfl: 11;  vitamin C (ASCORBIC ACID) 500 MG tablet, Take 500 mg by mouth daily., Disp: , Rfl:   No Known Allergies  ROS:  Denies fevers, headaches, dizziness, exertional chest pain, palpitations, URI symptoms, cough, shortness of breath, edema, nausea or vomiting, no bowel changes.  +upper abdominal/lower chest discomfort, some indigestion/heartburn.  Denies urinary complaints, skin rash or other concerns.  PHYSICAL EXAM: BP 128/90  Pulse 56  Ht 6\' 1"  (1.854 m)  Wt 233 lb (105.688 kg)  BMI 30.75 kg/m2 124/80 on repeat by MD Well developed, pleasant obese male (abdominal fat) in no distress HEENT:  PERRL, conjunctiva clear Neck: no lymphadenopathy, thyromegaly or mass Heart: regular rate and rhythm without  murmur Lungs: clear bilaterally Abdomen: Mild epigastric and RUQ tenderness. No rebound tenderness or mass, no guarding.  No hepatosplenomegaly or Murphy sign. Extremities: no edema Skin: no rash Psych: normal mood, affect,hygiene and grooming  EKG:  Sinus bradycardia, sinus arrhythmia.  Poor RWP with early transition.  Compared to EKG from 01/2011--unchanged.  No ST changes or other abnormality noted.  ASSESSMENT/PLAN:  Dyslipidemia  Chest pain - Plan: EKG 12-Lead  GERD (gastroesophageal reflux disease)  Essential hypertension, benign - controlled without medications at this point.  continue low sodium diet, increase exercise, and weight loss is recommended  H/o hypertension--still doing well off medication, despite recent weight gain.  Continue to increase exercise, follow low sodium diet, and try and lose weight.  Periodically monitor BP at home.  Dyslipidemia--unfortunately [facility] tests aren't back yet.  Will advise patient of results when available. We discussed increasing fish oil to 3000-4000 mg daily.  We discussed risks/side effects of both Niaspan and statins, so we can make recommendations once results are reviewed.  Epigastric/chest pain--EKG unchanged.  Like GI/reflux.  Doubt ulcer. Discussed trial of Prilosec OTC once daily  (or Zantac or Pepcid twice daily) for up to 2 weeks.  Reflux handout given, dietary recommendations reviewed.  Avoid anti-inflammatories and aspirin.  F/u based on lipid results

## 2013-03-09 NOTE — Patient Instructions (Signed)
Continue to increase exercise, follow low sodium diet, and try and lose weight.  Periodically monitor BP at home (goal is <130/80)  Dyslipidemia--unfortunately [facility] tests aren't back yet.  They should be back later this week or early next week, and we will call you when your booklet is ready for you to pick up. We discussed increasing fish oil to 3000-4000 mg daily.  This is probably a good idea regardless of results. We discussed risks/side effects of both Niaspan and statins, so we can make recommendations once results are reviewed.  Remember that if we start prescription Niaspan, it should be taken in the evening with a lowfat snack (applesauce is recommended), and to take aspirin 30-60 minutes prior (60 minutes if coated aspirin).  Can start with 81mg , and increase dose if having flushing.  Discussed trial of Prilosec OTC once daily  (or Zantac or Pepcid twice daily) for up to 2 weeks.  In the future, once you recognize which foods/meals trigger your symptoms, you can take a zantac or pepcid or prilosec prior to that meal.  Avoid anti-inflammatories and aspirin.  Diet for Gastroesophageal Reflux Disease, Adult Reflux (acid reflux) is when acid from your stomach flows up into the esophagus. When acid comes in contact with the esophagus, the acid causes irritation and soreness (inflammation) in the esophagus. When reflux happens often or so severely that it causes damage to the esophagus, it is called gastroesophageal reflux disease (GERD). Nutrition therapy can help ease the discomfort of GERD. FOODS OR DRINKS TO AVOID OR LIMIT  Smoking or chewing tobacco. Nicotine is one of the most potent stimulants to acid production in the gastrointestinal tract.  Caffeinated and decaffeinated coffee and black tea.  Regular or low-calorie carbonated beverages or energy drinks (caffeine-free carbonated beverages are allowed).   Strong spices, such as black pepper, white pepper, red pepper, cayenne, curry  powder, and chili powder.  Peppermint or spearmint.  Chocolate.  High-fat foods, including meats and fried foods. Extra added fats including oils, butter, salad dressings, and nuts. Limit these to less than 8 tsp per day.  Fruits and vegetables if they are not tolerated, such as citrus fruits or tomatoes.  Alcohol.  Any food that seems to aggravate your condition. If you have questions regarding your diet, call your caregiver or a registered dietitian. OTHER THINGS THAT MAY HELP GERD INCLUDE:   Eating your meals slowly, in a relaxed setting.  Eating 5 to 6 small meals per day instead of 3 large meals.  Eliminating food for a period of time if it causes distress.  Not lying down until 3 hours after eating a meal.  Keeping the head of your bed raised 6 to 9 inches (15 to 23 cm) by using a foam wedge or blocks under the legs of the bed. Lying flat may make symptoms worse.  Being physically active. Weight loss may be helpful in reducing reflux in overweight or obese adults.  Wear loose fitting clothing EXAMPLE MEAL PLAN This meal plan is approximately 2,000 calories based on https://www.[facility].org/ meal planning guidelines. Breakfast   cup cooked oatmeal.  1 cup strawberries.  1 cup low-fat milk.  1 oz almonds. Snack  1 cup cucumber slices.  6 oz yogurt (made from low-fat or fat-free milk). Lunch  2 slice whole-wheat bread.  2 oz sliced [place].  2 tsp mayonnaise.  1 cup blueberries.  1 cup snap peas. Snack  6 whole-wheat crackers.  1 oz string cheese. Dinner   cup brown rice.  1 cup mixed veggies.  1 tsp olive oil.  3 oz grilled fish. Document Released: 11/12/2005 Document Revised: 02/04/2012 Document Reviewed: 09/28/2011 [facility] Patient Information 2013 [place], [place].

## 2013-03-18 ENCOUNTER — Telehealth: Payer: Self-pay | Admitting: Family Medicine

## 2013-03-18 NOTE — Telephone Encounter (Signed)
Please call patient and advise that his [facility] results are in.  He can come pick up the booklet.  Save one copy of loose papers for [place], and scan into computer.  Booklet contains results (and there is an extra loose copy also).   Per report, his HDL is low, and it appears that he is an over-producer of cholesterol.  Most numbers are borderline.  Daily exercise, increasing fish oil to 3000-4000 mg daily will help.  I would recommend changing from OTC flush-free niacin to Rx Niaspan 1000 mg daily.  This is to be taken in the evening, with a lowfat snack (ie apple sauce), and take aspirin 30-60 minutes prior (1 hr if enteric coated, 30 mins if not coated) to taking Niaspan to help reduce risk of flushing. Give him sample and/or rx savings card.  If he prefers to hold off on Niaspan rx, then just exercise daily, fish oil as above, and recheck in 6 months (plain lipid panel).  If he starts on the Niaspan, then I recommend lipids and LFT's in 3 months.

## 2013-03-19 NOTE — Telephone Encounter (Signed)
Called and left a message for pt to call me back 

## 2013-03-20 MED ORDER — NIACIN ER (ANTIHYPERLIPIDEMIC) 1000 MG PO TBCR: 1000.0000 mg | EXTENDED_RELEASE_TABLET | Freq: Every day | ORAL | Status: DC

## 2013-03-20 NOTE — Telephone Encounter (Signed)
Pt notified and sent med to pharmacy as pt wanted and pt will schedule appt in 3 months to get labs done

## 2013-03-24 ENCOUNTER — Encounter: Payer: Self-pay | Admitting: Family Medicine

## 2013-03-25 ENCOUNTER — Other Ambulatory Visit (INDEPENDENT_AMBULATORY_CARE_PROVIDER_SITE_OTHER): Payer: 59

## 2013-03-25 ENCOUNTER — Encounter: Payer: Self-pay | Admitting: Family Medicine

## 2013-03-25 DIAGNOSIS — Z1211 Encounter for screening for malignant neoplasm of colon: Secondary | ICD-10-CM

## 2013-03-25 LAB — HEMOCCULT GUIAC POC 1CARD (OFFICE): Fecal Occult Blood, POC: NEGATIVE

## 2013-03-26 ENCOUNTER — Telehealth: Payer: Self-pay | Admitting: Family Medicine

## 2013-03-26 NOTE — Telephone Encounter (Signed)
Let message for patient to return my call.

## 2013-03-26 NOTE — Telephone Encounter (Signed)
Did rash persist or resolve?  Did he take the aspirin prior--how long prior and what dose?  If rash is already gone, then can retry tonight with higher dose aspirin (can take full 325, an hour prior). If ongoing rash/itching then may represent a true allergy, but starting that quickly is likely more just a side effect that can be prevented with the aspirin.  Also, taking it with some apple sauce

## 2013-03-27 ENCOUNTER — Telehealth: Payer: Self-pay | Admitting: Family Medicine

## 2013-03-27 NOTE — Telephone Encounter (Signed)
LEFT PT WORD FOR WORD MESSAGE ON HOME # Let him know that this is not necessarily unusual and taking an aspirin about a half an hour before he takes the niacin should help block this response

## 2013-03-27 NOTE — Telephone Encounter (Signed)
Let him know that this is not necessarily unusual and taking an aspirin about a half an hour before he takes the niacin should help block this response

## 2013-03-30 ENCOUNTER — Telehealth: Payer: Self-pay | Admitting: *Deleted

## 2013-03-30 NOTE — Telephone Encounter (Signed)
Left message for patient to return my call.

## 2013-04-02 ENCOUNTER — Telehealth: Payer: Self-pay | Admitting: *Deleted

## 2013-04-02 NOTE — Telephone Encounter (Signed)
Left message for patient to return my call.

## 2013-09-24 ENCOUNTER — Other Ambulatory Visit (INDEPENDENT_AMBULATORY_CARE_PROVIDER_SITE_OTHER): Payer: 59

## 2013-09-24 DIAGNOSIS — Z23 Encounter for immunization: Secondary | ICD-10-CM

## 2013-12-02 ENCOUNTER — Encounter: Payer: Self-pay | Admitting: Family Medicine

## 2013-12-02 ENCOUNTER — Ambulatory Visit (INDEPENDENT_AMBULATORY_CARE_PROVIDER_SITE_OTHER): Payer: 59 | Admitting: Family Medicine

## 2013-12-02 VITALS — BP 122/86 | HR 68 | Ht 73.0 in | Wt 229.0 lb

## 2013-12-02 DIAGNOSIS — Z0000 Encounter for general adult medical examination without abnormal findings: Secondary | ICD-10-CM | POA: Diagnosis not present

## 2013-12-02 DIAGNOSIS — N400 Enlarged prostate without lower urinary tract symptoms: Secondary | ICD-10-CM

## 2013-12-02 DIAGNOSIS — R5381 Other malaise: Secondary | ICD-10-CM

## 2013-12-02 DIAGNOSIS — K219 Gastro-esophageal reflux disease without esophagitis: Secondary | ICD-10-CM | POA: Insufficient documentation

## 2013-12-02 DIAGNOSIS — I10 Essential (primary) hypertension: Secondary | ICD-10-CM

## 2013-12-02 DIAGNOSIS — R5383 Other fatigue: Secondary | ICD-10-CM

## 2013-12-02 DIAGNOSIS — E669 Obesity, unspecified: Secondary | ICD-10-CM | POA: Insufficient documentation

## 2013-12-02 DIAGNOSIS — R7301 Impaired fasting glucose: Secondary | ICD-10-CM

## 2013-12-02 DIAGNOSIS — E785 Hyperlipidemia, unspecified: Secondary | ICD-10-CM

## 2013-12-02 DIAGNOSIS — Z125 Encounter for screening for malignant neoplasm of prostate: Secondary | ICD-10-CM

## 2013-12-02 LAB — CBC WITH DIFFERENTIAL/PLATELET
Basophils Absolute: 0 10*3/uL (ref 0.0–0.1)
Basophils Relative: 0 % (ref 0–1)
EOS ABS: 0.3 10*3/uL (ref 0.0–0.7)
EOS PCT: 4 % (ref 0–5)
HCT: 42.4 % (ref 39.0–52.0)
Hemoglobin: 14.9 g/dL (ref 13.0–17.0)
LYMPHS ABS: 1.8 10*3/uL (ref 0.7–4.0)
Lymphocytes Relative: 27 % (ref 12–46)
MCH: 31.5 pg (ref 26.0–34.0)
MCHC: 35.1 g/dL (ref 30.0–36.0)
MCV: 89.6 fL (ref 78.0–100.0)
MONO ABS: 0.6 10*3/uL (ref 0.1–1.0)
Monocytes Relative: 9 % (ref 3–12)
Neutro Abs: 4 10*3/uL (ref 1.7–7.7)
Neutrophils Relative %: 60 % (ref 43–77)
Platelets: 194 10*3/uL (ref 150–400)
RBC: 4.73 MIL/uL (ref 4.22–5.81)
RDW: 13 % (ref 11.5–15.5)
WBC: 6.7 10*3/uL (ref 4.0–10.5)

## 2013-12-02 LAB — POCT URINALYSIS DIPSTICK
BILIRUBIN UA: NEGATIVE
GLUCOSE UA: NEGATIVE
Ketones, UA: NEGATIVE
Leukocytes, UA: NEGATIVE
Nitrite, UA: NEGATIVE
Protein, UA: NEGATIVE
RBC UA: NEGATIVE
SPEC GRAV UA: 1.01
UROBILINOGEN UA: NEGATIVE
pH, UA: 6

## 2013-12-02 MED ORDER — TAMSULOSIN HCL 0.4 MG PO CAPS: 0.4000 mg | ORAL_CAPSULE | Freq: Every day | ORAL | Status: DC

## 2013-12-02 NOTE — Progress Notes (Signed)
Chief Complaint  Patient presents with  . Annual Exam    fasting annual exam. Did not do eye exam as he has appt scheduled with eye doctor next month.    Dillon Caldwell is a 63 y.o. male who presents for a complete physical.  He has the following concerns:  Heartburn--having 2-3x/week.  Gets discomfort in RUQ, relieved by a belch.  Sometimes happens while walking, relieved by belching.  He is able to keep walking without further problems.  No other exertional complaints.  He has been walking 2 hours/day since June. Admits to eating a lot of Chipotle and spicy foods. He bought some Prilosec OTC, but hasn't used yet.  Denies dysphagia.  H/o hypertension--off meds since 08/2012.  He checks BP periodically, and it runs 120/80.  Denies headaches, dizziness, chest pain.   BPH: Doing well on Flomax--stream remains stronger, and only occasionally gets up at night.  Dyslipidemia--Currently taking an OTC no-flush niacin once daily, and fish oil 1000mg  once daily.  He reports always having low HDL, even when very active, running marathons.  Had [facility] earlier this year, (reviewed results, which showed overproducer)  Immunization History  Administered Date(s) Administered  . Influenza Split 09/08/2012  . Influenza,inj,Quad PF,36+ Mos 09/24/2013  . Tdap 01/02/2012  . Zoster 12/03/2012   Last colonoscopy: 2008, believes he was told every 10 years (has had 2 colonoscopies, thinking he started age 49-46)  Last PSA: 11/2012 Dentist: Dr. Orlando Penner, UTD  Ophtho: Feb or March 2014, scheduled for the next month or two Exercise: Walks dogs 40-50 minutes daily (has to walk dogs separately), [place] 5[place] since June (training for walk from [place] to [place] over 6 months (the [place] trail). pilates 2x/week.   Past Medical History  Diagnosis Date  . Essential hypertension, benign     diagnosed age 16; off meds 08/2012  . BPH (benign prostatic hyperplasia)   .  Macular degeneration, dry 2011    followed by retinal specialist (dry)  . Tachycardia     likely h/o SVT, treated with ablation   . Erectile dysfunction     previously took Cialis  . Impaired fasting glucose     A1c 6.1 and fglu 127 01/2011  . Hypogonadism male     testosterone level 186 on 09/2010    Past Surgical History  Procedure Laterality Date  . Ablation of dysrhythmic focus  2006    ?PSVT by history  . Ankle surgery      both ankles with bone spurs (related to [place])  . Arthroscopic repair acl Left 2001    L knee  . Vasectomy    . Tonsillectomy  child    History   Social History  . Marital Status: Married    Spouse Name: N/A    Number of Children: 2  . Years of Education: N/A   Occupational History  . retired Pharmacist, hospital    Social History Main Topics  . Smoking status: Never Smoker   . Smokeless tobacco: Never Used  . Alcohol Use: Yes     Comment: maybe 3-4 per week.  . Drug Use: No  . Sexual Activity: Yes    Partners: Female   Other Topics Concern  . Not on file   Social History Narrative   Lives with wife, 2 dogs.  Son lives in [place], daughter in [place], teaching at [employer]--he takes care of his grandson.     Family History  Problem Relation Age  of Onset  . Diabetes Mother   . Heart disease Mother     angioplasty in her 103's  . Pulmonary fibrosis Mother   . Hypertension Mother   . Macular degeneration Mother   . Stroke Brother 48  . Crohn's disease Son     dx'd at age 18 months  . Macular degeneration Maternal Aunt   . Diabetes Maternal Aunt   . Macular degeneration Maternal Uncle   . Diabetes Maternal Uncle   . Cancer Maternal Uncle     prostate cancer  . Cancer Maternal Grandmother 77    colon cancer  . Macular degeneration Maternal Aunt   . Diabetes Maternal Aunt     Current outpatient prescriptions:MAGNESIUM CITRATE PO, Take 1 tablet by mouth daily., Disp: , Rfl: ;  Melatonin 3 MG CAPS, Take 2 capsules by mouth as needed., Disp: ,  Rfl: ;  Multiple Vitamins-Minerals (PRESERVISION AREDS 2 PO), Take 2 capsules by mouth daily., Disp: , Rfl: ;  niacin 500 MG tablet, Take 500 mg by mouth at bedtime., Disp: , Rfl: ;  Omega-3 Fatty Acids (FISH OIL) 1000 MG CAPS, Take 1 capsule by mouth daily., Disp: , Rfl:  tadalafil (CIALIS) 20 MG tablet, Take 0.5-1 tablets (10-20 mg total) by mouth every other day as needed for erectile dysfunction., Disp: 15 tablet, Rfl: 11;  tamsulosin (FLOMAX) 0.4 MG CAPS capsule, Take 1 capsule (0.4 mg total) by mouth daily., Disp: 30 capsule, Rfl: 11;  diphenhydramine-acetaminophen (TYLENOL PM) 25-500 MG TABS, Take 1 tablet by mouth at bedtime as needed., Disp: , Rfl:   No Known Allergies  ROS: The patient denies anorexia, fever, weight changes, headaches, ear pain, hoarseness, chest pain, dizziness, syncope, dyspnea on exertion, cough, swelling, nausea, vomiting, diarrhea, constipation, abdominal pain, melena, hematochezia, hematuria, incontinence, dysuria, genital lesions, joint pains, numbness, tingling, weakness, tremor, suspicious skin lesions, depression, anxiety, abnormal bleeding/bruising, or enlarged lymph nodes. +hearing loss--wears hearing aids. Tinnitus bilaterally (chronic)  +erectile dysfunction. Cialis is effective +Loss of hair on both lower legs, although notes that his knee hair has gotten "furrier" --ongoing/unchanged over the last year No longer having the sharp right ankle pains Occasional right shoulder pain--feels it popping. Sometimes feels like it is going to "pop out of joint"; pops and painful when he rolls on it at night sometimes. H/o injury in past (played rugby).  He lost an inch in pants size Occasional heartburn (2-3x/week) +trouble staying asleep past 5:30-6 (goes to bed 11-11:30).  He occasionally naps  PHYSICAL EXAM: BP 122/86  Pulse 68  Ht 6\' 1"  (1.854 m)  Wt 229 lb (103.874 kg)  BMI 30.22 kg/m2  General Appearance:  Alert, cooperative, no distress, appears stated age    Head:  Normocephalic, without obvious abnormality, atraumatic   Eyes:  PERRL, conjunctiva/corneas clear, EOM's intact, fundi  benign   Ears:  Wearing hearing aids bilaterally; TM's and EACs are normal upon their removal  Nose:  Nares normal, mucosa normal, no drainage or sinus tenderness   Throat:  Lips, mucosa, and tongue normal; teeth and gums normal   Neck:  Supple, no lymphadenopathy; thyroid: no enlargement/tenderness/nodules; no carotid  bruit or JVD   Back:  Spine nontender, no curvature, ROM normal, no CVA tenderness   Lungs:  Clear to auscultation bilaterally without wheezes, rales or ronchi; respirations unlabored   Chest Wall:  No tenderness or deformity   Heart:  Regular rate and rhythm, S1 and S2 normal, no murmur, rub  or gallop   Breast Exam:  No chest wall tenderness, masses or gynecomastia   Abdomen:  Soft, non-tender, nondistended, normoactive bowel sounds,  no masses, no hepatosplenomegaly. +abdominal obesity  Genitalia:  Normal male external genitalia without lesions. Testicles without masses. No inguinal hernias.   Rectal:  Normal sphincter tone, no masses or tenderness; guaiac negative stool. Prostate smooth, no nodules, not enlarged.   Extremities:  No clubbing, cyanosis or edema.   Pulses:  2+ and symmetric all extremities   Skin:  Skin color, texture, turgor normal, no rashes or lesions. Decreased hair on lower extremities bilaterally   Lymph nodes:  Cervical, supraclavicular, and axillary nodes normal   Neurologic:  CNII-XII intact, normal strength, sensation and gait; reflexes 2+ and symmetric throughout          Psych: Normal mood, affect, hygiene and grooming.    ASSESSMENT/PLAN  Routine general medical examination at a health care facility - Plan: POCT Urinalysis Dipstick, Lipid Panel, Comprehensive metabolic panel, TSH, PSA, CBC with Differential, Hemoglobin A1c  Dyslipidemia - re-check today.  consider generic niaspan at higher doses, if needed (vs  statin, if needed) - Plan: Lipid Panel, CANCELED: Lipid panel  BPH (benign prostatic hyperplasia) - well controlled - Plan: tamsulosin (FLOMAX) 0.4 MG CAPS capsule  Essential hypertension, benign - resolved; BP's normal off medications - Plan: Comprehensive metabolic panel, CBC with Differential, CANCELED: Comprehensive metabolic panel  Impaired fasting glucose - weight loss encouraged.  diet reviewed.  continue daily exercise - Plan: Hemoglobin A1c, CANCELED: Comprehensive metabolic panel, CANCELED: Hemoglobin A1c  Other malaise and fatigue - Plan: TSH, CBC with Differential, CANCELED: TSH, CANCELED: CBC with Differential  Special screening for malignant neoplasm of prostate - Plan: PSA, CANCELED: PSA  GERD (gastroesophageal reflux disease) - reviewed dietary/behavioral recommendations, and OTC meds (for prevention prior to spicy meal, vs prn meds)  Obesity (BMI 30.0-34.9)  Discussed PSA screening (risks/benefits), recommended at least 30 minutes of aerobic activity at least 5 days/week; proper sunscreen use reviewed; healthy diet and alcohol recommendations (less than or equal to 2 drinks/day) reviewed; regular seatbelt use; changing batteries in smoke detectors. Self-testicular exams. Immunization recommendations discussed--UTD.  Declined Hep A vaccine (which his wife got today--she does more of the traveling than he does).  Colonoscopy recommendations reviewed--UTD.  Add more weight-bearing exercise. Weight loss strongly encouraged (inches need to be lost at waist)

## 2013-12-02 NOTE — Patient Instructions (Signed)
HEALTH MAINTENANCE RECOMMENDATIONS:  It is recommended that you get at least 30 minutes of aerobic exercise at least 5 days/week (for weight loss, you may need as much as 60-90 minutes). This can be any activity that gets your heart rate up. This can be divided in 10-15 minute intervals if needed, but try and build up your endurance at least once a week.  Weight bearing exercise is also recommended twice weekly.  Eat a healthy diet with lots of vegetables, fruits and fiber.  "Colorful" foods have a lot of vitamins (ie green vegetables, tomatoes, red peppers, etc).  Limit sweet tea, regular sodas and alcoholic beverages, all of which has a lot of calories and sugar.  Up to 2 alcoholic drinks daily may be beneficial for men (unless trying to lose weight, watch sugars).  Drink a lot of water.  Sunscreen of at least SPF 30 should be used on all sun-exposed parts of the skin when outside between the hours of 10 am and 4 pm (not just when at beach or pool, but even with exercise, golf, tennis, and yard work!)  Use a sunscreen that says "broad spectrum" so it covers both UVA and UVB rays, and make sure to reapply every 1-2 hours.  Remember to change the batteries in your smoke detectors when changing your clock times in the spring and fall.  Use your seat belt every time you are in a car, and please drive safely and not be distracted with cell phones and texting while driving.  Diet for Gastroesophageal Reflux Disease, Adult Reflux (acid reflux) is when acid from your stomach flows up into the esophagus. When acid comes in contact with the esophagus, the acid causes irritation and soreness (inflammation) in the esophagus. When reflux happens often or so severely that it causes damage to the esophagus, it is called gastroesophageal reflux disease (GERD). Nutrition therapy can help ease the discomfort of GERD. FOODS OR DRINKS TO AVOID OR LIMIT  Smoking or chewing tobacco. Nicotine is one of the most potent  stimulants to acid production in the gastrointestinal tract.  Caffeinated and decaffeinated coffee and black tea.  Regular or low-calorie carbonated beverages or energy drinks (caffeine-free carbonated beverages are allowed).   Strong spices, such as black pepper, white pepper, red pepper, cayenne, curry powder, and chili powder.  Peppermint or spearmint.  Chocolate.  High-fat foods, including meats and fried foods. Extra added fats including oils, butter, salad dressings, and nuts. Limit these to less than 8 tsp per day.  Fruits and vegetables if they are not tolerated, such as citrus fruits or tomatoes.  Alcohol.  Any food that seems to aggravate your condition. If you have questions regarding your diet, call your caregiver or a registered dietitian. OTHER THINGS THAT MAY HELP GERD INCLUDE:   Eating your meals slowly, in a relaxed setting.  Eating 5 to 6 small meals per day instead of 3 large meals.  Eliminating food for a period of time if it causes distress.  Not lying down until 3 hours after eating a meal.  Keeping the head of your bed raised 6 to 9 inches (15 to 23 cm) by using a foam wedge or blocks under the legs of the bed. Lying flat may make symptoms worse.  Being physically active. Weight loss may be helpful in reducing reflux in overweight or obese adults.  Wear loose fitting clothing EXAMPLE MEAL PLAN This meal plan is approximately 2,000 calories based on [email or web address] meal planning guidelines. Breakfast  cup cooked oatmeal.  1 cup strawberries.  1 cup low-fat milk.  1 oz almonds. Snack  1 cup cucumber slices.  6 oz yogurt (made from low-fat or fat-free milk). Lunch  2 slice whole-wheat bread.  2 oz sliced [place].  2 tsp mayonnaise.  1 cup blueberries.  1 cup snap peas. Snack  6 whole-wheat crackers.  1 oz string cheese. Dinner   cup brown rice.  1 cup mixed veggies.  1 tsp olive oil.  3 oz grilled fish. Document  Released: 11/12/2005 Document Revised: 02/04/2012 Document Reviewed: 09/28/2011 [facility] Patient Information 2014 [place], [place].

## 2013-12-03 LAB — LIPID PANEL
Cholesterol: 196 mg/dL (ref 0–200)
HDL: 38 mg/dL — AB (ref >39)
LDL Cholesterol: 131 mg/dL — ABNORMAL HIGH (ref 0–99)
TRIGLYCERIDES: 135 mg/dL (ref <150)
Total CHOL/HDL Ratio: 5.2 Ratio
VLDL: 27 mg/dL (ref 0–40)

## 2013-12-03 LAB — COMPREHENSIVE METABOLIC PANEL WITH GFR
ALT: 35 U/L (ref 0–53)
AST: 34 U/L (ref 0–37)
Albumin: 4.5 g/dL (ref 3.5–5.2)
Alkaline Phosphatase: 91 U/L (ref 39–117)
BUN: 14 mg/dL (ref 6–23)
CO2: 30 meq/L (ref 19–32)
Calcium: 9.3 mg/dL (ref 8.4–10.5)
Chloride: 100 meq/L (ref 96–112)
Creat: 0.82 mg/dL (ref 0.50–1.35)
Glucose, Bld: 91 mg/dL (ref 70–99)
Potassium: 3.9 meq/L (ref 3.5–5.3)
Sodium: 138 meq/L (ref 135–145)
Total Bilirubin: 0.7 mg/dL (ref 0.3–1.2)
Total Protein: 6.8 g/dL (ref 6.0–8.3)

## 2013-12-03 LAB — TSH: TSH: 3.107 u[IU]/mL (ref 0.350–4.500)

## 2013-12-03 LAB — HEMOGLOBIN A1C
Hgb A1c MFr Bld: 6 % — ABNORMAL HIGH
Mean Plasma Glucose: 126 mg/dL — ABNORMAL HIGH

## 2013-12-03 LAB — PSA: PSA: 1.49 ng/mL

## 2014-01-04 ENCOUNTER — Encounter: Payer: 59 | Admitting: Family Medicine

## 2014-02-01 ENCOUNTER — Ambulatory Visit (INDEPENDENT_AMBULATORY_CARE_PROVIDER_SITE_OTHER): Payer: 59 | Admitting: Ophthalmology

## 2014-02-01 DIAGNOSIS — H35039 Hypertensive retinopathy, unspecified eye: Secondary | ICD-10-CM

## 2014-02-01 DIAGNOSIS — H3553 Other dystrophies primarily involving the sensory retina: Secondary | ICD-10-CM

## 2014-02-01 DIAGNOSIS — H2510 Age-related nuclear cataract, unspecified eye: Secondary | ICD-10-CM

## 2014-02-01 DIAGNOSIS — I10 Essential (primary) hypertension: Secondary | ICD-10-CM

## 2014-02-01 DIAGNOSIS — H43819 Vitreous degeneration, unspecified eye: Secondary | ICD-10-CM

## 2014-05-11 ENCOUNTER — Telehealth: Payer: Self-pay | Admitting: Internal Medicine

## 2014-05-11 DIAGNOSIS — N400 Enlarged prostate without lower urinary tract symptoms: Secondary | ICD-10-CM

## 2014-05-11 NOTE — Telephone Encounter (Signed)
Refill request for tamsulosin cap #90 to [facility]

## 2014-05-12 MED ORDER — TAMSULOSIN HCL 0.4 MG PO CAPS: 0.4000 mg | ORAL_CAPSULE | Freq: Every day | ORAL | Status: DC

## 2014-05-12 NOTE — Telephone Encounter (Signed)
Change from local HT to [facility].

## 2014-08-30 ENCOUNTER — Ambulatory Visit (INDEPENDENT_AMBULATORY_CARE_PROVIDER_SITE_OTHER): Payer: 59 | Admitting: Family Medicine

## 2014-08-30 ENCOUNTER — Encounter: Payer: Self-pay | Admitting: Family Medicine

## 2014-08-30 ENCOUNTER — Ambulatory Visit
Admission: RE | Admit: 2014-08-30 | Discharge: 2014-08-30 | Disposition: A | Discharge status: Home / Self Care | Payer: 59 | Source: Ambulatory Visit | Attending: Family Medicine | Admitting: Family Medicine

## 2014-08-30 VITALS — BP 140/78 | HR 60 | Temp 98.1°F | Ht 73.0 in | Wt 238.0 lb

## 2014-08-30 DIAGNOSIS — R059 Cough, unspecified: Secondary | ICD-10-CM

## 2014-08-30 DIAGNOSIS — R05 Cough: Secondary | ICD-10-CM

## 2014-08-30 DIAGNOSIS — Z23 Encounter for immunization: Secondary | ICD-10-CM

## 2014-08-30 DIAGNOSIS — J302 Other seasonal allergic rhinitis: Secondary | ICD-10-CM

## 2014-08-30 NOTE — Patient Instructions (Signed)
Drink plenty of fluids. Continue flonase (can change to 2 sprays each nostril once daily--gentle sniffs only). Change the claritin to EVERY day (or consider trying a different antihistamine, ie Allegra or Zyrtec). Consider sinus rinses (Sinus Rinse Kit or Neti-pot). Resume Mucinex 1278m twice daily (if buying the 12 hr version). Consider also taking Prilosec OTC once daily every day (and stop this first if your cough improves) If mucus changes, then we should add an antibiotic (or if there is no improvement, prior to seeing ENT)--you can call [place]. [facility])  If symptoms persist, we may need to send you to ENT for further eval.

## 2014-08-30 NOTE — Progress Notes (Signed)
Chief Complaint  Patient presents with  . Cough    feels like something is stuck in his airway. Has had a nagging cough since end of July when [place] While doing these miles this past summer slept in shelters known to have mice. Once home had 3 days of cold symptoms. Has used all OTC products without any help. Also states that while he was walking uphill only had some left sided pain that felt like something was poking out. Wonders if he may have a hernia.    See chief complaint above. He feels like there is something in his throat.  He coughs to try and clear his throat.  He can get something up, but doesn't relieve the sensation in his throat. Currently he feels like it is left sided, but sometimes is more central in the back of the throat.  Phlegm is thinner than he expected("like pus, not snot), "taupe" in color.  No yellow/green.  He does feel like there is postnasal drainage.  No runny nose, sneezing, itchy eyes.  Ongoing symptoms since July (not better or worse). He tried expectorants (no help), antihistamines (no change).  Denies heartburn, reflux/indigestion. He occasionally has something strange in his chest as well--no shortness of breath, no exertional chest pain.  He has also been using Flonase regularly without improvement (for about 3 weeks).  While on his trip, he felt like there was a "bulge" in the LUQ when walking uphill (no bulging was actually seen).  This was right at the bottom of the left lower ribs.  Not really a discomfort or pain, but felt better to press on the area.  Once he felt water trickling as he was pressing on this area while drinking water. No dysphagia, heartburn, abdominal pain.  Past Medical History  Diagnosis Date  . Essential hypertension, benign     diagnosed age 63 off meds 08/2012  . BPH (benign prostatic hyperplasia)   . Macular degeneration, dry 2011    followed by  retinal specialist (dry)  . Tachycardia     likely h/o SVT, treated with ablation   . Erectile dysfunction     previously took Cialis  . Impaired fasting glucose     A1c 6.1 and fglu 127 01/2011  . Hypogonadism male     testosterone level 186 on 09/2010   Past Surgical History  Procedure Laterality Date  . Ablation of dysrhythmic focus  2006    ?PSVT by history  . Ankle surgery      both ankles with bone spurs (related to [place])  . Arthroscopic repair acl Left 2001    L knee  . Vasectomy    . Tonsillectomy  child   History   Social History  . Marital Status: Married    Spouse Name: N/A    Number of Children: 2  . Years of Education: N/A   Occupational History  . retired tPharmacist, hospital   Social History Main Topics  . Smoking status: Never Smoker   . Smokeless tobacco: Never Used  . Alcohol Use: Yes     Comment: maybe 3-4 per week.  . Drug Use: No  . Sexual Activity: Yes    Partners: Female   Other Topics Concern  . Not on file   Social History Narrative   Lives with wife, 2 dogs.  [place] [employer]--he takes care of his  grandson.    Outpatient Encounter Prescriptions as of 08/30/2014  Medication Sig Note  . DiphenhydrAMINE HCl, Sleep, (ZZZQUIL) 25 MG CAPS Take 2 capsules by mouth as needed.   . fluticasone (FLONASE) 50 MCG/ACT nasal spray Place 1 spray into both nostrils 2 (two) times daily.   Marland Kitchen loratadine (CLARITIN) 10 MG tablet Take 10 mg by mouth daily. 08/30/2014: Taking intermittently (not daily)  . MAGNESIUM CITRATE PO Take 1 tablet by mouth daily.   . Melatonin 3 MG CAPS Take 2 capsules by mouth as needed.   . Multiple Vitamins-Minerals (PRESERVISION AREDS 2 PO) Take 2 capsules by mouth daily.   . niacin 500 MG tablet Take 500 mg by mouth at bedtime. 12/02/2013: Takes in the morning  . Omega-3 Fatty Acids (FISH OIL) 1000 MG CAPS Take 1 capsule by mouth daily.   . tadalafil (CIALIS) 20 MG tablet Take 0.5-1 tablets (10-20  mg total) by mouth every other day as needed for erectile dysfunction.   . tamsulosin (FLOMAX) 0.4 MG CAPS capsule Take 1 capsule (0.4 mg total) by mouth daily.   . [DISCONTINUED] diphenhydramine-acetaminophen (TYLENOL PM) 25-500 MG TABS Take 1 tablet by mouth at bedtime as needed.    No Known Allergies  ROS:  No fevers, chills, nausea, vomiting, heartburn/reflux.  No exertional chest pain or shortness of breath.  +postnasal drainage, cough.  No bleeding/bruising, rash, headaches, dizziness, edema or other concerns.  See HPI.  PHYSICAL EXAM: BP 140/78  Pulse 60  Temp(Src) 98.1 F (36.7 C) (Tympanic)  Ht 6' 1"  (1.854 m)  Wt 238 lb (107.956 kg)  BMI 31.41 kg/m2 Pleasant male in no distress HEENT:  PERRL, EOMI, conjunctiva clear. Partial cerumen impaction on the left, visualized TM was normal.  Right EAC and TM normal.  Nasal mucosa is moderately edematous with clear mucus noted bilaterally.  There is thick white mucus noted at the posterior upper throat. OP is otherwise normal Neck: no lymphadenopathy, thyromegaly or mass Heart: regular rate and rhythm without murmur Lungs: clear bilaterally. No wheeze, rales, ronchi. Normal air movement Abdomen: soft, nontender, no organomegaly or mass.  Active bowel sounds. Mild ventral hernia, reducible, nontender.  No mass or discomfort over area of concern at LUQ. Extremities: no edema Neuro: alert and oriented. Cranial nerves intact. Normal gait Psych: normal mood, affect, hygiene and grooming  ASSESSMENT/PLAN:  Cough - suspect related to PND (vs GERD--treat for both, and taper off PPI if cough resolves).  Consider ENT referral if persistent cough - Plan: DG Chest 2 View  Need for prophylactic vaccination and inoculation against influenza - Plan: Flu Vaccine QUAD 36+ mos PF IM (Fluarix Quad PF)  Seasonal allergies  Check CXR given ongoing cough, as well as travel history. Offered Tessalon--declines.  Feels like he is making himself cough to  try and clear throat, controllable.  Drink plenty of fluids. Continue flonase (can change to 2 sprays each nostril once daily--gentle sniffs only). Change the claritin to EVERY day (or consider trying a different antihistamine, ie Allegra or Zyrtec). Consider sinus rinses (Sinus Rinse Kit or Neti-pot). Resume Mucinex 1241m twice daily (if buying the 12 hr version). Consider also taking Prilosec OTC once daily every day (and stop this first if your cough improves) If mucus changes, then we should add an antibiotic (or if there is no improvement, prior to seeing ENT)--you can call [place]for prescription. [facility] ([address])  If symptoms persist, we may need to send  you to ENT for further eval.

## 2014-09-06 ENCOUNTER — Other Ambulatory Visit: Payer: Self-pay | Admitting: Family Medicine

## 2014-09-06 ENCOUNTER — Encounter: Payer: Self-pay | Admitting: Family Medicine

## 2014-09-06 MED ORDER — AZITHROMYCIN 250 MG PO TABS: ORAL_TABLET | ORAL | Status: DC

## 2014-10-23 ENCOUNTER — Other Ambulatory Visit: Payer: Self-pay | Admitting: Family Medicine

## 2014-10-25 ENCOUNTER — Telehealth: Payer: Self-pay | Admitting: Family Medicine

## 2014-10-25 NOTE — Telephone Encounter (Signed)
Scheduled patient with Dr.Bates ayt [facility] #[id number] for 09/28/14 @ 1:50pm. Called patient and left him a message informing him of this.

## 2014-10-25 NOTE — Telephone Encounter (Signed)
Pt wants referral for ENT, said you all had talked about it before.  He has sinus issues again, almost gets better then comes back on 3rd cycle thru it now.

## 2014-10-25 NOTE — Telephone Encounter (Signed)
Ok to refer to ENT. Recurrent sinus problems, and feeling like something get stuck in his throat.

## 2014-12-30 ENCOUNTER — Encounter: Payer: Self-pay | Admitting: Family Medicine

## 2014-12-30 ENCOUNTER — Ambulatory Visit (INDEPENDENT_AMBULATORY_CARE_PROVIDER_SITE_OTHER): Payer: Managed Care, Other (non HMO) | Admitting: Family Medicine

## 2014-12-30 VITALS — BP 110/74 | HR 60 | Temp 97.8°F | Ht 73.0 in | Wt 230.0 lb

## 2014-12-30 DIAGNOSIS — Z0000 Encounter for general adult medical examination without abnormal findings: Secondary | ICD-10-CM

## 2014-12-30 DIAGNOSIS — Z5181 Encounter for therapeutic drug level monitoring: Secondary | ICD-10-CM

## 2014-12-30 DIAGNOSIS — R7309 Other abnormal glucose: Secondary | ICD-10-CM

## 2014-12-30 DIAGNOSIS — N400 Enlarged prostate without lower urinary tract symptoms: Secondary | ICD-10-CM

## 2014-12-30 DIAGNOSIS — R7303 Prediabetes: Secondary | ICD-10-CM

## 2014-12-30 DIAGNOSIS — Z125 Encounter for screening for malignant neoplasm of prostate: Secondary | ICD-10-CM

## 2014-12-30 DIAGNOSIS — K219 Gastro-esophageal reflux disease without esophagitis: Secondary | ICD-10-CM

## 2014-12-30 DIAGNOSIS — E785 Hyperlipidemia, unspecified: Secondary | ICD-10-CM

## 2014-12-30 LAB — COMPREHENSIVE METABOLIC PANEL
ALBUMIN: 4.4 g/dL (ref 3.5–5.2)
ALT: 24 U/L (ref 0–53)
AST: 25 U/L (ref 0–37)
Alkaline Phosphatase: 94 U/L (ref 39–117)
BUN: 24 mg/dL — ABNORMAL HIGH (ref 6–23)
CHLORIDE: 107 meq/L (ref 96–112)
CO2: 26 mEq/L (ref 19–32)
CREATININE: 0.87 mg/dL (ref 0.50–1.35)
Calcium: 9.1 mg/dL (ref 8.4–10.5)
Glucose, Bld: 105 mg/dL — ABNORMAL HIGH (ref 70–99)
POTASSIUM: 4.5 meq/L (ref 3.5–5.3)
SODIUM: 142 meq/L (ref 135–145)
TOTAL PROTEIN: 6.8 g/dL (ref 6.0–8.3)
Total Bilirubin: 0.6 mg/dL (ref 0.2–1.2)

## 2014-12-30 LAB — POCT URINALYSIS DIPSTICK
Bilirubin, UA: NEGATIVE
Blood, UA: NEGATIVE
Glucose, UA: NEGATIVE
Ketones, UA: NEGATIVE
LEUKOCYTES UA: NEGATIVE
NITRITE UA: NEGATIVE
Protein, UA: NEGATIVE
Spec Grav, UA: >=1.03
Urobilinogen, UA: NEGATIVE
pH, UA: 6

## 2014-12-30 LAB — CBC WITH DIFFERENTIAL/PLATELET
Basophils Absolute: 0.1 10*3/uL (ref 0.0–0.1)
Basophils Relative: 1 % (ref 0–1)
EOS ABS: 0.3 10*3/uL (ref 0.0–0.7)
EOS PCT: 6 % — AB (ref 0–5)
HEMATOCRIT: 43.2 % (ref 39.0–52.0)
Hemoglobin: 14.3 g/dL (ref 13.0–17.0)
LYMPHS PCT: 29 % (ref 12–46)
Lymphs Abs: 1.5 10*3/uL (ref 0.7–4.0)
MCH: 30.7 pg (ref 26.0–34.0)
MCHC: 33.1 g/dL (ref 30.0–36.0)
MCV: 92.7 fL (ref 78.0–100.0)
MPV: 9.7 fL (ref 8.6–12.4)
Monocytes Absolute: 0.5 10*3/uL (ref 0.1–1.0)
Monocytes Relative: 10 % (ref 3–12)
NEUTROS ABS: 2.9 10*3/uL (ref 1.7–7.7)
Neutrophils Relative %: 54 % (ref 43–77)
Platelets: 205 10*3/uL (ref 150–400)
RBC: 4.66 MIL/uL (ref 4.22–5.81)
RDW: 13.1 % (ref 11.5–15.5)
WBC: 5.3 10*3/uL (ref 4.0–10.5)

## 2014-12-30 LAB — LIPID PANEL
CHOL/HDL RATIO: 4.7 ratio
CHOLESTEROL: 178 mg/dL (ref 0–200)
HDL: 38 mg/dL — AB (ref >39)
LDL Cholesterol: 123 mg/dL — ABNORMAL HIGH (ref 0–99)
TRIGLYCERIDES: 83 mg/dL (ref <150)
VLDL: 17 mg/dL (ref 0–40)

## 2014-12-30 MED ORDER — TAMSULOSIN HCL 0.4 MG PO CAPS: ORAL_CAPSULE | ORAL | Status: DC

## 2014-12-30 NOTE — Patient Instructions (Signed)
  HEALTH MAINTENANCE RECOMMENDATIONS:  It is recommended that you get at least 30 minutes of aerobic exercise at least 5 days/week (for weight loss, you may need as much as 60-90 minutes). This can be any activity that gets your heart rate up. This can be divided in 10-15 minute intervals if needed, but try and build up your endurance at least once a week.  Weight bearing exercise is also recommended twice weekly.  Eat a healthy diet with lots of vegetables, fruits and fiber.  "Colorful" foods have a lot of vitamins (ie green vegetables, tomatoes, red peppers, etc).  Limit sweet tea, regular sodas and alcoholic beverages, all of which has a lot of calories and sugar.  Up to 2 alcoholic drinks daily may be beneficial for men (unless trying to lose weight, watch sugars).  Drink a lot of water.  Sunscreen of at least SPF 30 should be used on all sun-exposed parts of the skin when outside between the hours of 10 am and 4 pm (not just when at beach or pool, but even with exercise, golf, tennis, and yard work!)  Use a sunscreen that says "broad spectrum" so it covers both UVA and UVB rays, and make sure to reapply every 1-2 hours.  Remember to change the batteries in your smoke detectors when changing your clock times in the spring and fall.  Use your seat belt every time you are in a car, and please drive safely and not be distracted with cell phones and texting while driving.   Follow up as scheduled with ENT to discuss your ongoing problems with left side of throat.  Use prilosec as needed prior to meals that might trigger reflux/heartburn (doesn't need to be 2 weeks all or nothing, can be "as needed", and can be longer than 2 weeks at a time, if needed.

## 2014-12-30 NOTE — Progress Notes (Signed)
Chief Complaint  Patient presents with  . Annual Exam    fasting annual exam. Did not do eye exam as he has one scheduled for next month. Still has congestion, still feels like he has something in his throat and he cannot clear it. Also has a mole on right arm he would like you to look at.   . Medication Refill    needs refill on flomax sent to [facility].   Dillon Caldwell is a 64 y.o. male who presents for a complete physical.  He has the following concerns:  Still feels like something is getting stuck on the left side of his throat, like something is in the way.  He continues to have some congestion, always coughing something up, "taupe".  Denies sinus pain, pressure, ear pain. He has seen ENT, but scope wasn't performed.  He has f/u scheduled.  GERD--he stopped eating lunch at [place] (to lose weight and help with symptoms).  no longer having belching or heartburn.  He has used prilosec x total of 4 weeks, last was 6 weeks ago. It helped with RUQ discomfort; no change in swallowing sensation/throat problem.  H/o hypertension--off meds since 08/2012. He hasn't been checking his BP elsewhere, but sometimes will see it up to 140/80 (when sick, on decongestants). Denies headaches, dizziness, chest pain.   BPH: Doing well on Flomax--stream remains stronger, and only occasionally gets up at night. He admits that there were times he didn't take it for a couple of weeks, and he noticed a change--felt "like I got kicked in the balls 3 weeks ago", a slight discomfort.  Resolved with restarting the flomax. No significant change in urine stream or nocturia.  Sometimes gets up once a night.  Dyslipidemia--Currently taking an OTC no-flush niacin once daily, and fish oil 1000mg  once daily. He reports always having low HDL, even when very active, running marathons. Previously had [facility] study which showed overproducer   Immunization History  Administered Date(s) Administered  .  Influenza Split 09/08/2012  . Influenza,inj,Quad PF,36+ Mos 09/24/2013, 08/30/2014  . Tdap 01/02/2012  . Zoster 12/03/2012   Last colonoscopy: 2008, believes he was told every 10 years (has had 2 colonoscopies, thinking he started age 23-46)  Last PSA: 11/2013 Dentist: Dr. Orlando Penner, UTD Ophtho: yearly, scheduled for March. Exercise: Walks dogs 40-50 minutes daily (has to walk dogs separately), [place] 5[place] since June (training for walk from [place] to [place] over 6 months (the [place] trail--leaves March 20th). pilates 2x/week.   Past Medical History  Diagnosis Date  . Essential hypertension, benign     diagnosed age 19; off meds 08/2012  . BPH (benign prostatic hyperplasia)   . Macular degeneration, dry 2011    followed by retinal specialist (dry)  . Tachycardia     likely h/o SVT, treated with ablation   . Erectile dysfunction     previously took Cialis  . Impaired fasting glucose     A1c 6.1 and fglu 127 01/2011  . Hypogonadism male     testosterone level 186 on 09/2010    Past Surgical History  Procedure Laterality Date  . Ablation of dysrhythmic focus  2006    ?PSVT by history  . Ankle surgery      both ankles with bone spurs (related to [place])  . Arthroscopic repair acl Left 2001    L knee  . Vasectomy    . Tonsillectomy  child  History   Social History  . Marital Status: Married    Spouse Name: N/A    Number of Children: 2  . Years of Education: N/A   Occupational History  . retired Pharmacist, hospital    Social History Main Topics  . Smoking status: Never Smoker   . Smokeless tobacco: Never Used  . Alcohol Use: Yes     Comment: maybe 3-4 per week.  . Drug Use: No  . Sexual Activity:    Partners: Female   Other Topics Concern  . Not on file   Social History Narrative   Lives with wife, 2 dogs.  Son lives in [place], daughter in [place], teaching at [employer]--he takes care of his grandson.     Family History   Problem Relation Age of Onset  . Diabetes Mother   . Heart disease Mother     angioplasty in her 58's  . Pulmonary fibrosis Mother   . Hypertension Mother   . Macular degeneration Mother   . Stroke Brother 48  . Crohn's disease Son     dx'd at age 9 months  . Macular degeneration Maternal Aunt   . Diabetes Maternal Aunt   . Macular degeneration Maternal Uncle   . Diabetes Maternal Uncle   . Cancer Maternal Uncle     prostate cancer  . Cancer Maternal Grandmother 54    colon cancer  . Macular degeneration Maternal Aunt   . Diabetes Maternal Aunt     Outpatient Encounter Prescriptions as of 12/30/2014  Medication Sig Note  . fluticasone (FLONASE) 50 MCG/ACT nasal spray Place 1 spray into both nostrils 2 (two) times daily.   Marland Kitchen MAGNESIUM CITRATE PO Take 1 tablet by mouth daily.   . Melatonin 3 MG CAPS Take 2 capsules by mouth as needed.   . Multiple Vitamins-Minerals (PRESERVISION AREDS 2 PO) Take 2 capsules by mouth daily.   . niacin 500 MG tablet Take 500 mg by mouth at bedtime. 12/02/2013: Takes in the morning  . Omega-3 Fatty Acids (FISH OIL) 1000 MG CAPS Take 1 capsule by mouth daily.   . tadalafil (CIALIS) 20 MG tablet Take 0.5-1 tablets (10-20 mg total) by mouth every other day as needed for erectile dysfunction.   . tamsulosin (FLOMAX) 0.4 MG CAPS capsule Take 1 capsule by mouth  daily   . loratadine (CLARITIN) 10 MG tablet Take 10 mg by mouth daily. 08/30/2014: Taking intermittently (not daily)  . [DISCONTINUED] azithromycin (ZITHROMAX) 250 MG tablet Take 2 tablets by mouth on first day, then 1 tablet by mouth on days 2 through 5   . [DISCONTINUED] DiphenhydrAMINE HCl, Sleep, (ZZZQUIL) 25 MG CAPS Take 2 capsules by mouth as needed.     No Known Allergies  ROS: The patient denies anorexia, fever, weight changes, headaches, ear pain, hoarseness, chest pain, dizziness, syncope, dyspnea on exertion, cough, swelling, nausea, vomiting, diarrhea, constipation, abdominal pain,  melena, hematochezia, hematuria, incontinence, dysuria, genital lesions, joint pains, numbness, tingling, weakness, tremor, suspicious skin lesions, depression, anxiety, abnormal bleeding/bruising, or enlarged lymph nodes. +hearing loss--wears hearing aids. Tinnitus bilaterally (chronic)  +erectile dysfunction. Cialis is effective +Loss of hair on both lower legs; hair at knees.  Unchanged from last few years  Occasional right shoulder pain--has pain at his right shoulder, some things he can't do in pilates, so avoids those positions.  Can't sleep on his right side or has pain/popping. Weight has been stable +trouble staying asleep past 5:30-6 (goes to bed 11-11:30). He occasionally naps in the afternoon  PHYSICAL EXAM: BP 110/74 mmHg  Pulse 60  Temp(Src) 97.8 F (36.6 C) (Tympanic)  Ht 6\' 1"  (1.854 m)  Wt 230 lb (104.327 kg)  BMI 30.35 kg/m2  General Appearance:  Alert, cooperative, no distress, appears stated age   Head:  Normocephalic, without obvious abnormality, atraumatic   Eyes:  PERRL, conjunctiva/corneas clear, EOM's intact, fundi  benign   Ears:  Wearing hearing aids bilaterally; TM's and EACs are normal upon their removal  Nose:  Nares normal, nasal mucosa with moderate edema, slight white/yellow drainage/crusting; no sinus tenderness   Throat:  Lips, mucosa, and tongue normal; teeth and gums normal   Neck:  Supple, no lymphadenopathy; thyroid: no enlargement/tenderness/nodules; no carotid  bruit or JVD   Back:  Spine nontender, no curvature, ROM normal, no CVA tenderness   Lungs:  Clear to auscultation bilaterally without wheezes, rales or ronchi; respirations unlabored   Chest Wall:  No tenderness or deformity   Heart:  Regular rate and rhythm, S1 and S2 normal, no murmur, rub  or gallop   Breast Exam:  No chest wall tenderness, masses or gynecomastia   Abdomen:  Soft, non-tender, nondistended, normoactive bowel sounds,  no masses, no  hepatosplenomegaly. +abdominal obesity  Genitalia:  Normal male external genitalia without lesions. Testicles without masses. No inguinal hernias.   Rectal:  Normal sphincter tone, no masses or tenderness; guaiac negative stool. Prostate smooth, no nodules, not enlarged.   Extremities:  No clubbing, cyanosis or edema.   Pulses:  2+ and symmetric all extremities   Skin:  Skin color, texture, turgor normal, no rashes or lesions. Decreased hair on lower extremities bilaterally. 0.5 cm, symmetric (but wider at superior portion than inferior), light brown, uniform color, normal margins/edges.  Lymph nodes:  Cervical, supraclavicular, and axillary nodes normal   Neurologic:  CNII-XII intact, normal strength, sensation and gait; reflexes 2+ and symmetric throughout    Psych:  Normal mood, affect, hygiene and grooming.   ASSESSMENT/PLAN:  Annual physical exam - Plan: POCT Urinalysis Dipstick, Lipid panel, Comprehensive metabolic panel, CBC with Differential/Platelet, TSH  Screening for prostate cancer - Plan: PSA  Dyslipidemia - Plan: Lipid panel, Comprehensive metabolic panel  BPH (benign prostatic hyperplasia) - controlled - Plan: PSA  Gastroesophageal reflux disease, esophagitis presence not specified - continue appropriate diet; weight loss encouraged; PPI prn  Prediabetes - Plan: Hemoglobin A1c  Medication monitoring encounter - Plan: Lipid panel, Comprehensive metabolic panel, CBC with Differential/Platelet, TSH  GERD--consider using prilosec prior to meals that are known to trigger reflux (so can use it "as needed", not just two weeks at a time.)  Discussed PSA screening (risks/benefits), recommended at least 30 minutes of aerobic activity at least 5 days/week, weight-bearing exercise 2x/week; proper sunscreen use reviewed; healthy diet and alcohol recommendations (less than or equal to 2 drinks/day) reviewed; regular seatbelt use; changing batteries  in smoke detectors. Self-testicular exams. Immunization recommendations discussed--UTD. Prevnar-13 next year. Colonoscopy recommendations reviewed--UTD.

## 2014-12-31 LAB — HEMOGLOBIN A1C
Hgb A1c MFr Bld: 6 % — ABNORMAL HIGH (ref <5.7)
Mean Plasma Glucose: 126 mg/dL — ABNORMAL HIGH (ref <117)

## 2014-12-31 LAB — PSA: PSA: 1.88 ng/mL (ref <=4.00)

## 2014-12-31 LAB — TSH: TSH: 2.185 u[IU]/mL (ref 0.350–4.500)

## 2015-01-24 ENCOUNTER — Encounter: Payer: Self-pay | Admitting: Family Medicine

## 2015-02-07 ENCOUNTER — Ambulatory Visit (INDEPENDENT_AMBULATORY_CARE_PROVIDER_SITE_OTHER): Payer: Managed Care, Other (non HMO) | Admitting: Ophthalmology

## 2015-02-07 DIAGNOSIS — H43813 Vitreous degeneration, bilateral: Secondary | ICD-10-CM

## 2015-02-07 DIAGNOSIS — H2513 Age-related nuclear cataract, bilateral: Secondary | ICD-10-CM | POA: Diagnosis not present

## 2015-02-07 DIAGNOSIS — H3531 Nonexudative age-related macular degeneration: Secondary | ICD-10-CM

## 2015-02-07 DIAGNOSIS — I10 Essential (primary) hypertension: Secondary | ICD-10-CM

## 2015-02-07 DIAGNOSIS — H35033 Hypertensive retinopathy, bilateral: Secondary | ICD-10-CM

## 2015-03-31 ENCOUNTER — Other Ambulatory Visit: Payer: Self-pay | Admitting: Sports Medicine

## 2015-03-31 ENCOUNTER — Telehealth: Payer: Self-pay | Admitting: Family Medicine

## 2015-03-31 DIAGNOSIS — M25511 Pain in right shoulder: Secondary | ICD-10-CM

## 2015-03-31 NOTE — Telephone Encounter (Signed)
[place] and [place] 3 weeks ago injuring rt shoulder.  Came home and fell again last night injuring same shoulder.  He wants referral to ortho.  Pt ph [phone] .  I advised he may need to be seen first.  Please advise.

## 2015-03-31 NOTE — Telephone Encounter (Signed)
Okay to refer to [facility]

## 2015-03-31 NOTE — Telephone Encounter (Signed)
This was sent to me-please advise. Thx.

## 2015-04-01 ENCOUNTER — Ambulatory Visit
Admission: RE | Admit: 2015-04-01 | Discharge: 2015-04-01 | Disposition: A | Discharge status: Home / Self Care | Payer: Medicare Other | Source: Ambulatory Visit | Attending: Sports Medicine | Admitting: Sports Medicine

## 2015-04-01 DIAGNOSIS — M7581 Other shoulder lesions, right shoulder: Secondary | ICD-10-CM | POA: Diagnosis not present

## 2015-04-01 DIAGNOSIS — M25511 Pain in right shoulder: Secondary | ICD-10-CM | POA: Diagnosis not present

## 2015-04-04 DIAGNOSIS — M25511 Pain in right shoulder: Secondary | ICD-10-CM | POA: Diagnosis not present

## 2015-04-05 ENCOUNTER — Telehealth: Payer: Self-pay | Admitting: Internal Medicine

## 2015-04-05 NOTE — Telephone Encounter (Signed)
Pt gave me the verbal ok to send physical notes from February visit to orthopedic surgical center since he is having surgery Friday.  Faxed over notes to [phone]

## 2015-04-08 DIAGNOSIS — W19XXXA Unspecified fall, initial encounter: Secondary | ICD-10-CM | POA: Diagnosis not present

## 2015-04-08 DIAGNOSIS — S46011A Strain of muscle(s) and tendon(s) of the rotator cuff of right shoulder, initial encounter: Secondary | ICD-10-CM | POA: Diagnosis not present

## 2015-04-08 DIAGNOSIS — M94211 Chondromalacia, right shoulder: Secondary | ICD-10-CM | POA: Diagnosis not present

## 2015-04-08 DIAGNOSIS — S43491A Other sprain of right shoulder joint, initial encounter: Secondary | ICD-10-CM | POA: Diagnosis not present

## 2015-04-08 DIAGNOSIS — M7541 Impingement syndrome of right shoulder: Secondary | ICD-10-CM | POA: Diagnosis not present

## 2015-04-08 DIAGNOSIS — Y929 Unspecified place or not applicable: Secondary | ICD-10-CM | POA: Diagnosis not present

## 2015-04-08 DIAGNOSIS — S46201A Unspecified injury of muscle, fascia and tendon of other parts of biceps, right arm, initial encounter: Secondary | ICD-10-CM | POA: Diagnosis not present

## 2015-04-08 DIAGNOSIS — G8918 Other acute postprocedural pain: Secondary | ICD-10-CM | POA: Diagnosis not present

## 2015-04-08 DIAGNOSIS — M19011 Primary osteoarthritis, right shoulder: Secondary | ICD-10-CM | POA: Diagnosis not present

## 2015-04-08 HISTORY — PX: ROTATOR CUFF REPAIR: SHX139

## 2015-04-11 DIAGNOSIS — M25611 Stiffness of right shoulder, not elsewhere classified: Secondary | ICD-10-CM | POA: Diagnosis not present

## 2015-04-11 DIAGNOSIS — M25511 Pain in right shoulder: Secondary | ICD-10-CM | POA: Diagnosis not present

## 2015-04-11 DIAGNOSIS — M75121 Complete rotator cuff tear or rupture of right shoulder, not specified as traumatic: Secondary | ICD-10-CM | POA: Diagnosis not present

## 2015-04-13 ENCOUNTER — Telehealth: Payer: Self-pay | Admitting: Internal Medicine

## 2015-04-13 ENCOUNTER — Encounter: Payer: Self-pay | Admitting: Family Medicine

## 2015-04-13 DIAGNOSIS — M25511 Pain in right shoulder: Secondary | ICD-10-CM | POA: Diagnosis not present

## 2015-04-13 DIAGNOSIS — M25611 Stiffness of right shoulder, not elsewhere classified: Secondary | ICD-10-CM | POA: Diagnosis not present

## 2015-04-13 DIAGNOSIS — M75121 Complete rotator cuff tear or rupture of right shoulder, not specified as traumatic: Secondary | ICD-10-CM | POA: Diagnosis not present

## 2015-04-13 NOTE — Telephone Encounter (Signed)
Faxed over medical records to surgical center of [facility] on 04/05/15 @[phone]

## 2015-04-14 DIAGNOSIS — M25511 Pain in right shoulder: Secondary | ICD-10-CM | POA: Diagnosis not present

## 2015-04-18 DIAGNOSIS — M25611 Stiffness of right shoulder, not elsewhere classified: Secondary | ICD-10-CM | POA: Diagnosis not present

## 2015-04-18 DIAGNOSIS — M75121 Complete rotator cuff tear or rupture of right shoulder, not specified as traumatic: Secondary | ICD-10-CM | POA: Diagnosis not present

## 2015-04-18 DIAGNOSIS — M25511 Pain in right shoulder: Secondary | ICD-10-CM | POA: Diagnosis not present

## 2015-04-20 DIAGNOSIS — M25611 Stiffness of right shoulder, not elsewhere classified: Secondary | ICD-10-CM | POA: Diagnosis not present

## 2015-04-20 DIAGNOSIS — M25511 Pain in right shoulder: Secondary | ICD-10-CM | POA: Diagnosis not present

## 2015-04-20 DIAGNOSIS — M75121 Complete rotator cuff tear or rupture of right shoulder, not specified as traumatic: Secondary | ICD-10-CM | POA: Diagnosis not present

## 2015-04-22 DIAGNOSIS — M25611 Stiffness of right shoulder, not elsewhere classified: Secondary | ICD-10-CM | POA: Diagnosis not present

## 2015-04-22 DIAGNOSIS — M25511 Pain in right shoulder: Secondary | ICD-10-CM | POA: Diagnosis not present

## 2015-04-22 DIAGNOSIS — M75121 Complete rotator cuff tear or rupture of right shoulder, not specified as traumatic: Secondary | ICD-10-CM | POA: Diagnosis not present

## 2015-04-26 DIAGNOSIS — M25511 Pain in right shoulder: Secondary | ICD-10-CM | POA: Diagnosis not present

## 2015-04-26 DIAGNOSIS — M75121 Complete rotator cuff tear or rupture of right shoulder, not specified as traumatic: Secondary | ICD-10-CM | POA: Diagnosis not present

## 2015-04-26 DIAGNOSIS — M25611 Stiffness of right shoulder, not elsewhere classified: Secondary | ICD-10-CM | POA: Diagnosis not present

## 2015-04-28 DIAGNOSIS — M25511 Pain in right shoulder: Secondary | ICD-10-CM | POA: Diagnosis not present

## 2015-04-28 DIAGNOSIS — M75121 Complete rotator cuff tear or rupture of right shoulder, not specified as traumatic: Secondary | ICD-10-CM | POA: Diagnosis not present

## 2015-04-28 DIAGNOSIS — M25611 Stiffness of right shoulder, not elsewhere classified: Secondary | ICD-10-CM | POA: Diagnosis not present

## 2015-05-02 DIAGNOSIS — M25511 Pain in right shoulder: Secondary | ICD-10-CM | POA: Diagnosis not present

## 2015-05-02 DIAGNOSIS — M75121 Complete rotator cuff tear or rupture of right shoulder, not specified as traumatic: Secondary | ICD-10-CM | POA: Diagnosis not present

## 2015-05-02 DIAGNOSIS — M25611 Stiffness of right shoulder, not elsewhere classified: Secondary | ICD-10-CM | POA: Diagnosis not present

## 2015-05-04 DIAGNOSIS — M25611 Stiffness of right shoulder, not elsewhere classified: Secondary | ICD-10-CM | POA: Diagnosis not present

## 2015-05-04 DIAGNOSIS — M75121 Complete rotator cuff tear or rupture of right shoulder, not specified as traumatic: Secondary | ICD-10-CM | POA: Diagnosis not present

## 2015-05-04 DIAGNOSIS — M25511 Pain in right shoulder: Secondary | ICD-10-CM | POA: Diagnosis not present

## 2015-05-06 DIAGNOSIS — M25611 Stiffness of right shoulder, not elsewhere classified: Secondary | ICD-10-CM | POA: Diagnosis not present

## 2015-05-06 DIAGNOSIS — M75121 Complete rotator cuff tear or rupture of right shoulder, not specified as traumatic: Secondary | ICD-10-CM | POA: Diagnosis not present

## 2015-05-06 DIAGNOSIS — M25511 Pain in right shoulder: Secondary | ICD-10-CM | POA: Diagnosis not present

## 2015-05-09 DIAGNOSIS — M25511 Pain in right shoulder: Secondary | ICD-10-CM | POA: Diagnosis not present

## 2015-05-11 DIAGNOSIS — M25511 Pain in right shoulder: Secondary | ICD-10-CM | POA: Diagnosis not present

## 2015-05-11 DIAGNOSIS — M25611 Stiffness of right shoulder, not elsewhere classified: Secondary | ICD-10-CM | POA: Diagnosis not present

## 2015-05-11 DIAGNOSIS — M75121 Complete rotator cuff tear or rupture of right shoulder, not specified as traumatic: Secondary | ICD-10-CM | POA: Diagnosis not present

## 2015-05-13 DIAGNOSIS — M75121 Complete rotator cuff tear or rupture of right shoulder, not specified as traumatic: Secondary | ICD-10-CM | POA: Diagnosis not present

## 2015-05-13 DIAGNOSIS — M25611 Stiffness of right shoulder, not elsewhere classified: Secondary | ICD-10-CM | POA: Diagnosis not present

## 2015-05-13 DIAGNOSIS — M25511 Pain in right shoulder: Secondary | ICD-10-CM | POA: Diagnosis not present

## 2015-05-16 DIAGNOSIS — M25611 Stiffness of right shoulder, not elsewhere classified: Secondary | ICD-10-CM | POA: Diagnosis not present

## 2015-05-16 DIAGNOSIS — M25511 Pain in right shoulder: Secondary | ICD-10-CM | POA: Diagnosis not present

## 2015-05-16 DIAGNOSIS — M75121 Complete rotator cuff tear or rupture of right shoulder, not specified as traumatic: Secondary | ICD-10-CM | POA: Diagnosis not present

## 2015-05-18 DIAGNOSIS — M75121 Complete rotator cuff tear or rupture of right shoulder, not specified as traumatic: Secondary | ICD-10-CM | POA: Diagnosis not present

## 2015-05-18 DIAGNOSIS — M25511 Pain in right shoulder: Secondary | ICD-10-CM | POA: Diagnosis not present

## 2015-05-18 DIAGNOSIS — M25611 Stiffness of right shoulder, not elsewhere classified: Secondary | ICD-10-CM | POA: Diagnosis not present

## 2015-05-20 DIAGNOSIS — M75121 Complete rotator cuff tear or rupture of right shoulder, not specified as traumatic: Secondary | ICD-10-CM | POA: Diagnosis not present

## 2015-05-20 DIAGNOSIS — M25511 Pain in right shoulder: Secondary | ICD-10-CM | POA: Diagnosis not present

## 2015-05-20 DIAGNOSIS — M25611 Stiffness of right shoulder, not elsewhere classified: Secondary | ICD-10-CM | POA: Diagnosis not present

## 2015-06-01 DIAGNOSIS — M75121 Complete rotator cuff tear or rupture of right shoulder, not specified as traumatic: Secondary | ICD-10-CM | POA: Diagnosis not present

## 2015-06-01 DIAGNOSIS — M25511 Pain in right shoulder: Secondary | ICD-10-CM | POA: Diagnosis not present

## 2015-06-01 DIAGNOSIS — M25611 Stiffness of right shoulder, not elsewhere classified: Secondary | ICD-10-CM | POA: Diagnosis not present

## 2015-06-03 DIAGNOSIS — M75121 Complete rotator cuff tear or rupture of right shoulder, not specified as traumatic: Secondary | ICD-10-CM | POA: Diagnosis not present

## 2015-06-03 DIAGNOSIS — M25611 Stiffness of right shoulder, not elsewhere classified: Secondary | ICD-10-CM | POA: Diagnosis not present

## 2015-06-03 DIAGNOSIS — M25511 Pain in right shoulder: Secondary | ICD-10-CM | POA: Diagnosis not present

## 2015-06-07 DIAGNOSIS — M25511 Pain in right shoulder: Secondary | ICD-10-CM | POA: Diagnosis not present

## 2015-06-09 DIAGNOSIS — M25511 Pain in right shoulder: Secondary | ICD-10-CM | POA: Diagnosis not present

## 2015-06-09 DIAGNOSIS — M75121 Complete rotator cuff tear or rupture of right shoulder, not specified as traumatic: Secondary | ICD-10-CM | POA: Diagnosis not present

## 2015-06-09 DIAGNOSIS — M25611 Stiffness of right shoulder, not elsewhere classified: Secondary | ICD-10-CM | POA: Diagnosis not present

## 2015-06-16 DIAGNOSIS — M75121 Complete rotator cuff tear or rupture of right shoulder, not specified as traumatic: Secondary | ICD-10-CM | POA: Diagnosis not present

## 2015-06-16 DIAGNOSIS — M25611 Stiffness of right shoulder, not elsewhere classified: Secondary | ICD-10-CM | POA: Diagnosis not present

## 2015-06-16 DIAGNOSIS — M25511 Pain in right shoulder: Secondary | ICD-10-CM | POA: Diagnosis not present

## 2015-06-22 DIAGNOSIS — M25611 Stiffness of right shoulder, not elsewhere classified: Secondary | ICD-10-CM | POA: Diagnosis not present

## 2015-06-22 DIAGNOSIS — M25511 Pain in right shoulder: Secondary | ICD-10-CM | POA: Diagnosis not present

## 2015-06-22 DIAGNOSIS — M75121 Complete rotator cuff tear or rupture of right shoulder, not specified as traumatic: Secondary | ICD-10-CM | POA: Diagnosis not present

## 2015-06-23 ENCOUNTER — Ambulatory Visit (INDEPENDENT_AMBULATORY_CARE_PROVIDER_SITE_OTHER): Payer: Medicare Other | Admitting: Family Medicine

## 2015-06-23 ENCOUNTER — Ambulatory Visit
Admission: RE | Admit: 2015-06-23 | Discharge: 2015-06-23 | Disposition: A | Discharge status: Home / Self Care | Payer: Self-pay | Source: Ambulatory Visit | Attending: Family Medicine | Admitting: Family Medicine

## 2015-06-23 ENCOUNTER — Encounter: Payer: Self-pay | Admitting: Family Medicine

## 2015-06-23 VITALS — BP 122/82 | HR 60 | Ht 73.0 in | Wt 234.0 lb

## 2015-06-23 DIAGNOSIS — M79641 Pain in right hand: Secondary | ICD-10-CM

## 2015-06-23 DIAGNOSIS — M19041 Primary osteoarthritis, right hand: Secondary | ICD-10-CM | POA: Diagnosis not present

## 2015-06-23 MED ORDER — MELOXICAM 15 MG PO TABS: 15.0000 mg | ORAL_TABLET | Freq: Every day | ORAL | Status: DC

## 2015-06-23 NOTE — Patient Instructions (Signed)
  Go to [facility] ([address] [place]) for had x-ray today. I suspect there may be some arthritis, but that there is some soft tissue inflammation contributing to the pain even moreso.  Possibly a tendonitis of the flexor tendon.  Take the meloxicam once daily until your pain has resolved/significantly improved. Take your prescribed anti-inflammatory medication with food; discontinue or cut back the dose if you develop stomach pain/discomfort/side effects.  Do not take other over-the-counter pain medications such as ibuprofen, advil, motrin, aleve, naproxen, Goody's or BC powder at the same time.  Do not use longer than recommended.  It is okay to use acetaminophen (tylenol) along with this medication.  If you have ongoing pain in the hand, I recommend seeing a hand specialist.  Dr. Amedeo Plenty at [facility] or Dr. Burney Gauze or any of his partners at the [facility] (either on [address] or [address]).

## 2015-06-23 NOTE — Progress Notes (Signed)
Chief Complaint  Patient presents with  . Hand Pain    right hand pain that began when sling from rotator cuff came off (June 24th). Worst pain is in between ring and pinky.    He is having pain between the 4th and 5th fingers on the right hand.  This started flaring the day he took off the sling after healing from rotator cuff surgery. He has been taking 2 aleve twice daily since 6/25. It originally felt like a "railroad spike" driven between his hand, and now it has calmed down to just "a nail". He had noticed some swelling in the right 5th PIP. The sling really bothered his right elbow. He hasn't been having any discomfort, numbness or tingling in the forearm or hand/fingers since the pain started. He has some numbness along the ulnar side of his arm sometimes when he uses trekking poles (only started using in the last week), and sometimes when he is asleep in certain positions, but not at other times when hand hurts. 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  PMH, PSH, SH reviewed  Current Outpatient Prescriptions on File Prior to Visit  Medication Sig Dispense Refill  . MAGNESIUM CITRATE PO Take 1 tablet by mouth daily.    . Multiple Vitamins-Minerals (PRESERVISION AREDS 2 PO) Take 2 capsules by mouth daily.    . niacin 500 MG tablet Take 500 mg by mouth at bedtime.    . Omega-3 Fatty Acids (FISH OIL) 1000 MG CAPS Take 1 capsule by mouth daily.    . fluticasone (FLONASE) 50 MCG/ACT nasal spray Place 1 spray into both nostrils 2 (two) times daily.    Marland Kitchen loratadine (CLARITIN) 10 MG tablet Take 10 mg by mouth daily.     No current facility-administered medications on file prior to visit.   No Known Allergies  ROS: no URI symptoms, fever, chills, cough, shortness of breath, chest pain, GI complaints, bleeding, bruising, rash or any other complaints.  See HPI.  PHYSICAL EXAM: BP 122/82 mmHg  Pulse 60  Ht 6\' 1"  (1.854 m)  Wt 234 lb (106.142 kg)   BMI 30.88 kg/m2 Well developed, pleasant male in no distress Right hand: Pain with flexion of right 4th finger against resistance. Mildly tender along the distal shaft of the 5th metacarpal No mass, swelling, erythema, warmth. FROM of fingers/hand Brisk capillary refill. Normal strength, sensation  ASSESSMENT/PLAN:  Right hand pain - Plan: DG Hand Complete Right, meloxicam (MOBIC) 15 MG tablet  Suspect tendonitis; suspect mild arthritis in the fingers (PIP), but not necessarily what is causing his new discomfort.  Go to [facility] ([address] or [place]) for had x-ray today. I suspect there may be some arthritis, but that there is some soft tissue inflammation contributing to the pain even moreso.  Possibly a tendonitis of the flexor tendon.  Take the meloxicam once daily until your pain has resolved/significantly improved. Take your prescribed anti-inflammatory medication with food; discontinue or cut back the dose if you develop stomach pain/discomfort/side effects.  Do not take other over-the-counter pain medications such as ibuprofen, advil, motrin, aleve, naproxen, Goody's or BC powder at the same time.  Do not use longer than recommended.  It is okay to use acetaminophen (tylenol) along with this medication.  If you have ongoing pain in the hand, I recommend seeing a hand specialist.  Dr. Amedeo Plenty at [facility] or Dr. Burney Gauze or any of his partners at the [facility] (  either on [place] or [address]).

## 2015-07-04 DIAGNOSIS — M79641 Pain in right hand: Secondary | ICD-10-CM | POA: Diagnosis not present

## 2015-07-04 DIAGNOSIS — M65341 Trigger finger, right ring finger: Secondary | ICD-10-CM | POA: Diagnosis not present

## 2015-07-12 DIAGNOSIS — M25511 Pain in right shoulder: Secondary | ICD-10-CM | POA: Diagnosis not present

## 2015-10-27 IMAGING — CR DG HAND COMPLETE 3+V*R*
3 series · 3 of 3 positions shown · non-contrast
Comparison: None.

CLINICAL DATA: Right hand pain between the fourth and fifth
metacarpophalangeal joints when flexing at the PIP joint of the
fourth finger, history of recent right rotator cuff repair with
onset of symptoms afterwards.

EXAM:
RIGHT HAND - COMPLETE 3+ VIEW

[x hand pa right]
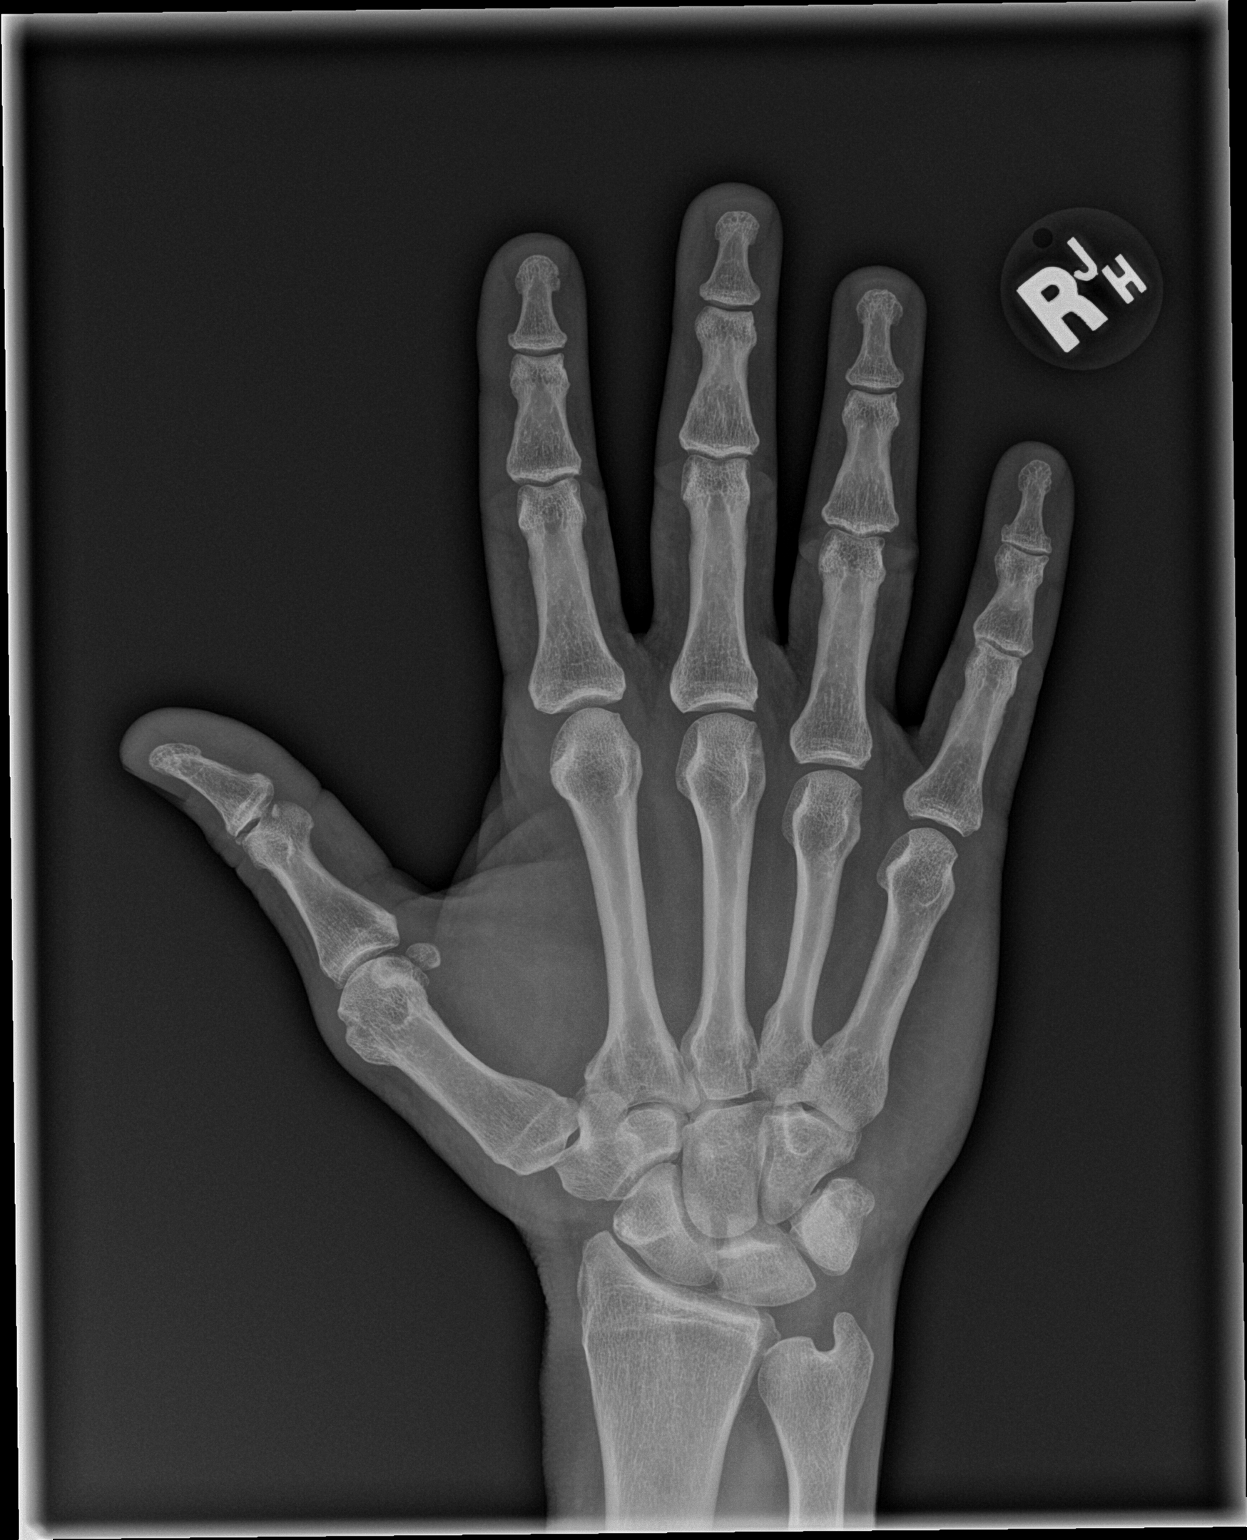

[x hand obl right]
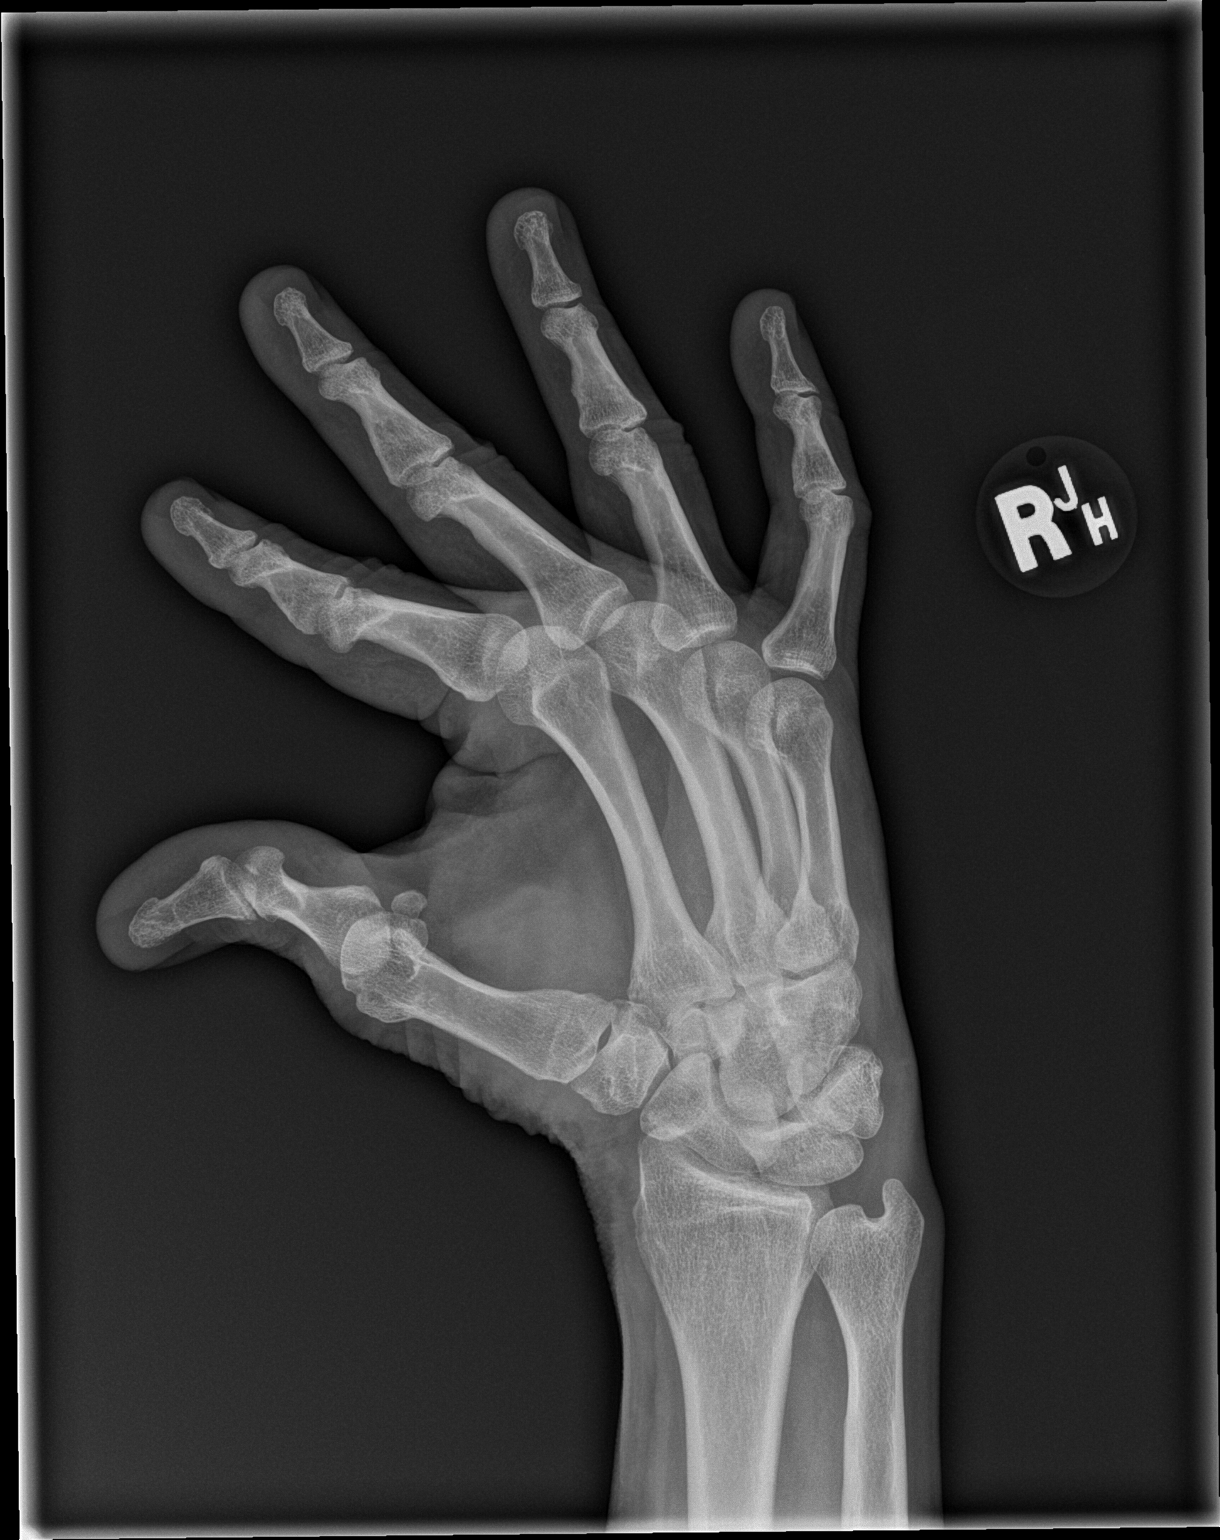

[x hand lat right]
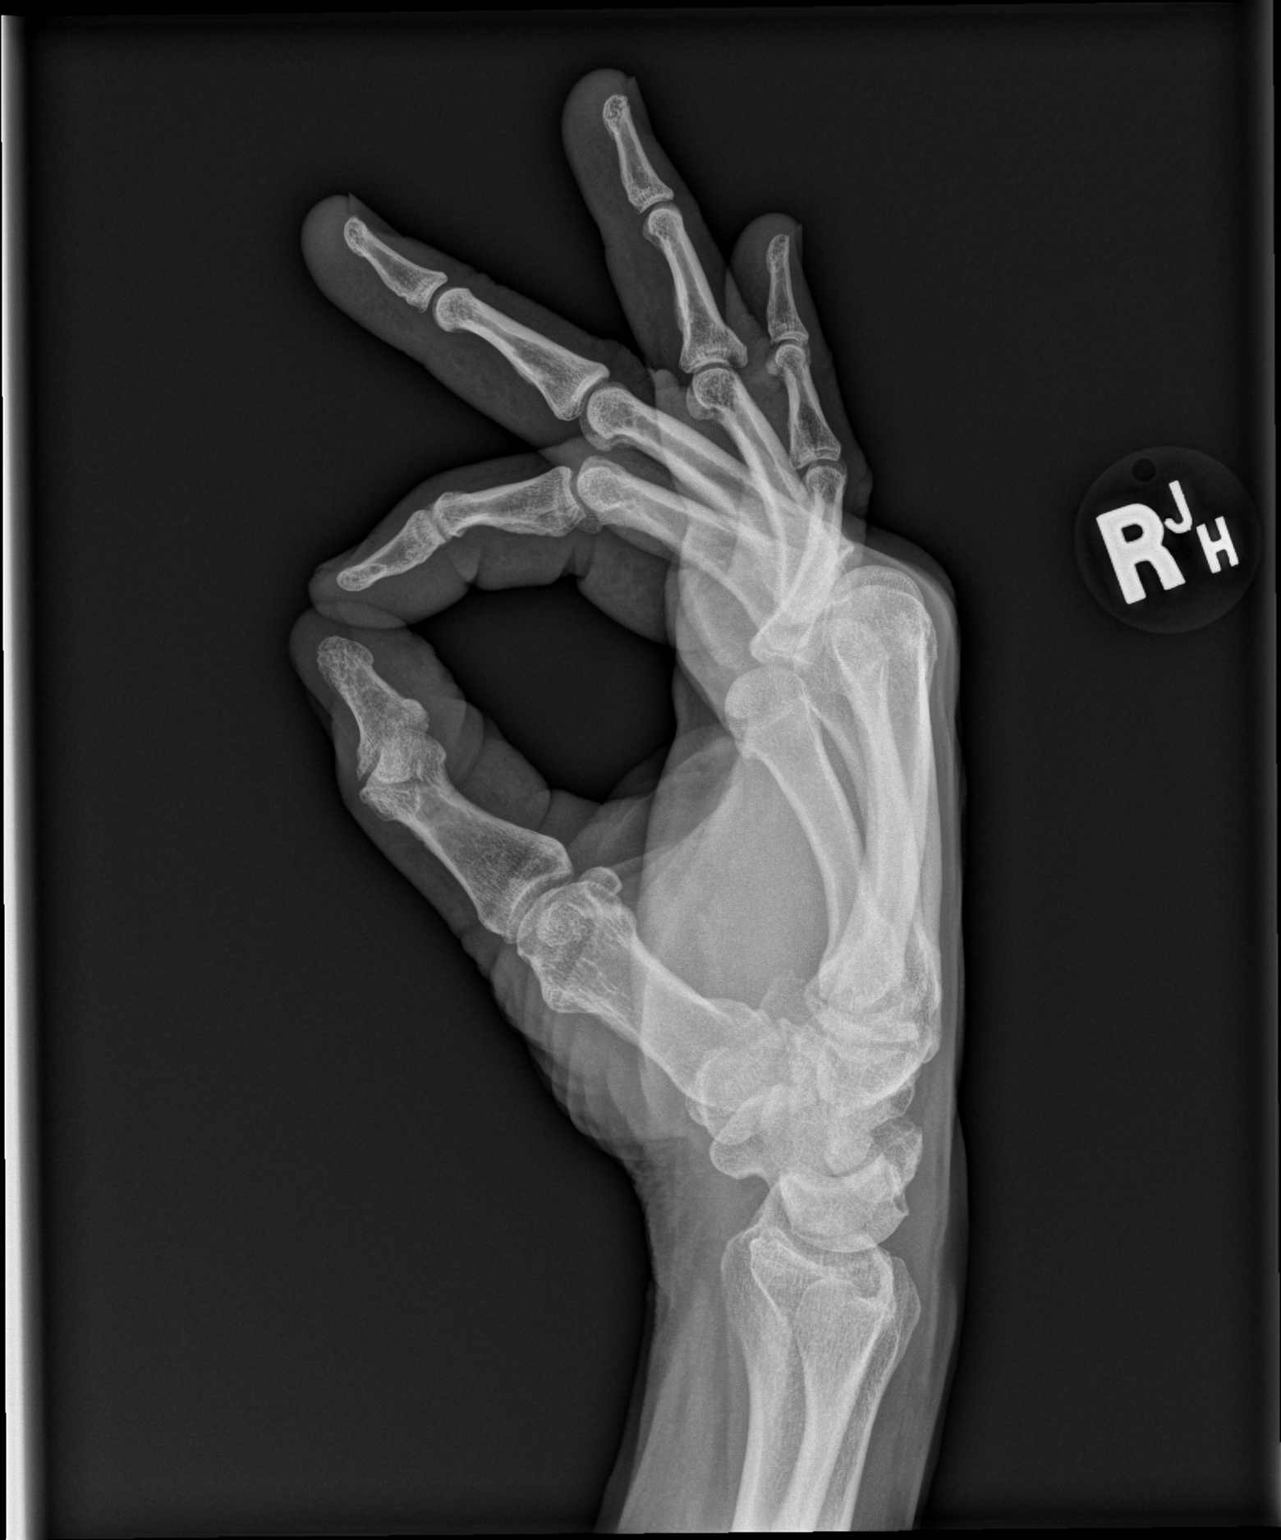

[3 of 3 positions shown; findings below may reference images not displayed]

FINDINGS: The bones of the right hand are adequately mineralized. There is no
acute or healing fracture and there is no dislocation. There is mild
narrowing of the PIP joint of the fourth finger and DIP joint of the
fifth finger. The MCP joints are preserved. There is no abnormal
soft tissue calcification. The carpals and metacarpals are
unremarkable.
IMPRESSION: There are mild osteoarthritic changes of the PIP joint of the fourth
finger and DIP joint of the fifth finger. There is no acute fracture
or dislocation. There are no soft tissue abnormalities.

## 2015-12-01 ENCOUNTER — Ambulatory Visit (INDEPENDENT_AMBULATORY_CARE_PROVIDER_SITE_OTHER): Payer: Medicare Other | Admitting: Family Medicine

## 2015-12-01 ENCOUNTER — Encounter: Payer: Self-pay | Admitting: Family Medicine

## 2015-12-01 VITALS — BP 110/68 | HR 56 | Ht 73.0 in | Wt 221.4 lb

## 2015-12-01 DIAGNOSIS — Z23 Encounter for immunization: Secondary | ICD-10-CM

## 2015-12-01 DIAGNOSIS — R229 Localized swelling, mass and lump, unspecified: Secondary | ICD-10-CM

## 2015-12-01 DIAGNOSIS — R220 Localized swelling, mass and lump, head: Secondary | ICD-10-CM

## 2015-12-01 NOTE — Patient Instructions (Signed)
   The lump on your head is not suspicious for malignancy.  It is likely either a cyst or a lipoma (benign fatty tumor).  We should continue to observe for change. Return sooner than your next appointment in August if you notice increase in size, shape, color, if it becomes swollen, red, tender, draining, or any other changes. I suspect it will remain exactly the same, or possibly get smaller/resolve.  If you desire removal (not changing, but you don't like it), then I'd be happy to refer you to a dermatologist.  Dr. Tonia Brooms and Dr. Delman Cheadle (on [facility]) are very good, but there are others as well.

## 2015-12-01 NOTE — Progress Notes (Signed)
Chief Complaint  Patient presents with  . Cyst    left side of forehead, wife noticed it a week ago-but unsure of how long it has actually been there for.    His wife is now working in [place].  She returned home Christmas and noticed the lump on the left side of his head.  She did not mention or notice is over Thanksgiving. Patient hadn't been aware of it at all--no discomfort.  No other concerns.  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  PMH, PSH, SH reviewed.  Outpatient Encounter Prescriptions as of 12/01/2015  Medication Sig Note  . Multiple Vitamins-Minerals (PRESERVISION AREDS 2 PO) Take 2 capsules by mouth daily.   . [DISCONTINUED] fluticasone (FLONASE) 50 MCG/ACT nasal spray Place 1 spray into both nostrils 2 (two) times daily. Reported on 12/01/2015   . [DISCONTINUED] loratadine (CLARITIN) 10 MG tablet Take 10 mg by mouth daily. Reported on 12/01/2015 08/30/2014: Taking intermittently (not daily)  . [DISCONTINUED] MAGNESIUM CITRATE PO Take 1 tablet by mouth daily. Reported on 12/01/2015   . [DISCONTINUED] meloxicam (MOBIC) 15 MG tablet Take 1 tablet (15 mg total) by mouth daily. (Patient not taking: Reported on 12/01/2015)   . [DISCONTINUED] niacin 500 MG tablet Take 500 mg by mouth at bedtime. Reported on 12/01/2015 12/02/2013: Takes in the morning  . [DISCONTINUED] Omega-3 Fatty Acids (FISH OIL) 1000 MG CAPS Take 1 capsule by mouth daily. Reported on 12/01/2015    No facility-administered encounter medications on file as of 12/01/2015.   No Known Allergies  ROS: no fever, chills, bleeding, bruising, rash, URI symptoms, GI complaints, or other skin concerns.  PHYSICAL EXAM: BP 110/68 mmHg  Pulse 56  Ht 6\' 1"  (1.854 m)  Wt 221 lb 6.4 oz (100.426 kg)  BMI 29.22 kg/m2 Well developed, pleasant male, in good spirits, in no distress.  Skin:  1.25-1.5 cm subcutanous, mobile soft tissue mass at the left upper temple area.  It is flesh colored, no erythema.  At the 7 o'clock  position there is a small (23mm) flesh-colored to slightly pink raised bump.  There is no drainage, pustule, crusting, erythema or warmth. Nontender. There is a flat brown pigmented macular lesion off to the right side, next to the raised area.  ASSESSMENT/PLAN:  Scalp mass - suspect cyst vs lipoma.  Observe/monitor  Need for prophylactic vaccination and inoculation against influenza - Plan: Flu vaccine HIGH DOSE PF (Fluzone High dose)    The lump on your head is not suspicious for malignancy.  It is likely either a cyst or a lipoma (benign fatty tumor).  We should continue to observe for change. Return sooner than your next appointment in August if you notice increase in size, shape, color, if it becomes swollen, red, tender, draining, or any other changes. I suspect it will remain exactly the same, or possibly get smaller/resolve.  If you desire removal (not changing, but you don't like it), then I'd be happy to refer you to a dermatologist.  Dr. Tonia Brooms and Dr. Delman Cheadle (on [facility]) are very good, but there are others as well.

## 2015-12-14 ENCOUNTER — Encounter: Payer: Managed Care, Other (non HMO) | Admitting: Family Medicine

## 2016-02-08 ENCOUNTER — Ambulatory Visit (INDEPENDENT_AMBULATORY_CARE_PROVIDER_SITE_OTHER): Payer: Medicare Other | Admitting: Ophthalmology

## 2016-02-08 DIAGNOSIS — I10 Essential (primary) hypertension: Secondary | ICD-10-CM | POA: Diagnosis not present

## 2016-02-08 DIAGNOSIS — H3553 Other dystrophies primarily involving the sensory retina: Secondary | ICD-10-CM | POA: Diagnosis not present

## 2016-02-08 DIAGNOSIS — H35033 Hypertensive retinopathy, bilateral: Secondary | ICD-10-CM

## 2016-02-08 DIAGNOSIS — H43813 Vitreous degeneration, bilateral: Secondary | ICD-10-CM | POA: Diagnosis not present

## 2016-02-29 DIAGNOSIS — H2513 Age-related nuclear cataract, bilateral: Secondary | ICD-10-CM | POA: Diagnosis not present

## 2016-06-26 NOTE — Progress Notes (Signed)
Chief Complaint  Patient presents with  . Medicare Wellness    fasting med check plus/AWV. No concerns.    Dillon Caldwell is a 65 y.o. male who presents for annual wellness visit and follow-up on chronic medical conditions.  His last CPE was prior to turning 64 (not seen within the first 12 months of being on Medicare, so not eligible for Welcome to Medicare physical).  He complains of some discomfort on his backside--was painful when he does teaser movement in pilates, thinks maybe he had a boil.  He now noticed a scrape in this area.  Denies any drainage, fever.  He has been busy hiking the [employer].  He reports he has 450 miles to go ([place] to [place]), which he plans on completing next year (when he moves to [place]). His wife accepted a position at the school up in [place]. He is currently caring for his new grandchild, and after a year will joint her in [place].  He was last seen for a concern of a lump noted on the left side of his scalp, noted by his wife when she was home from [place] over Christmas.  He does not think that this has changed.  Denies any tenderness/discomfort.  GERD--Belching or heartburn resolved with dietary changes in the past (stopped eating lunch at [employer]).  He has had a couple of flare ups, and uses prilosec prn (for a couple of nights in a row after a flare). He was sent to ENT (Dr. Redmond Baseman) last year with concern for globus sensation. Symptoms resolved, but intermittently will notice some trouble swallowing, such as pills getting hung up around his larynx area (not in chest).  H/o hypertension--off meds since 08/2012. He hasn't been checking his BP elsewhere. He has a monitor at home, doesn't use. Denies headaches, dizziness, chest pain.   BPH: He stopped taking Flomax last Spring and he denies any recurrent problems.  His stream is strong, no hesitation, and no nocturia.  ED:  Previously took Cialis with good results.  Would like refill  today.  Dyslipidemia--He previously took an OTC no-flush niacin once daily, and fish oil 105m once daily.He stopped taking these last year, when logistically it became difficult [place].  He reports always having low HDL, even when very active, running marathons. 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 He tries to follow a lowfat, low cholesterol diet and gets regular exercise.  Lab Results  Component Value Date   CHOL 178 12/30/2014   HDL 38 (L) 12/30/2014   LDLCALC 123 (H) 12/30/2014   TRIG 83 12/30/2014   CHOLHDL 4.7 12/30/2014  (taking OTC niacin and fish oil when this was checked)  Immunization History  Administered Date(s) Administered  . Influenza Split 09/08/2012  . Influenza, High Dose Seasonal PF 12/01/2015  . Influenza,inj,Quad PF,36+ Mos 09/24/2013, 08/30/2014  . Tdap 01/02/2012  . Zoster 12/03/2012   Last colonoscopy: 2008/09, believes he was told every 10 years (has had 2 colonoscopies, thinking he started age [age]  Last PSA: 12/2014 Dentist: Dr. ROrlando Pennertwo times/year Ophtho: yearly, every March to Dr. MZigmund DanielExercise: [facility] Currently is running 1 hour 4 days/week.  Also walks dogs 1 mile/day, pilates 1x/week.   Other doctors caring for patient include: Dentist: Dr. ROrlando PennerOphtho: [facility] [address], and Dr. MZigmund Daniel(retinal specialist) ENT: Dr. BRedmond BasemanOrtho: Dr. WNoemi Chapel Depression screen:  Negative Fall screen: + related to hiking, no significant injuries (prior fall  with injury was 03/2015, slipped on stairs while at home). Functional status screen: notable for decreased hearing; has hearing aids bilaterally  End of Life Discussion:  Patient has a living will and medical power of attorney  Past Medical History:  Diagnosis Date  . BPH (benign prostatic hyperplasia)   . Erectile dysfunction    previously took Cialis  . Essential hypertension, benign    diagnosed age 14; off  meds 08/2012  . Hypogonadism male    testosterone level 186 on 09/2010  . Impaired fasting glucose    A1c 6.1 and fglu 127 01/2011  . Macular degeneration, dry 2011   followed by retinal specialist (dry)  . Tachycardia    likely h/o SVT, treated with ablation     Past Surgical History:  Procedure Laterality Date  . ABLATION OF DYSRHYTHMIC FOCUS  2006   ?PSVT by history  . ANKLE SURGERY     both ankles with bone spurs (related to [place])  . ARTHROSCOPIC REPAIR ACL Left 2001   L knee  . FOOT SURGERY Bilateral    bone spurs removed from both ankles in the 70's (different years, similar surgery bilaterally)  . ROTATOR CUFF REPAIR Right 04/08/15  . TONSILLECTOMY  child  . VASECTOMY      Social History   Social History  . Marital status: Married    Spouse name: N/A  . Number of children: 2  . Years of education: N/A   Occupational History  . retired Pharmacist, hospital    Social History Main Topics  . Smoking status: Never Smoker  . Smokeless tobacco: Never Used  . Alcohol use Yes     Comment: maybe 3-4 per week.  . Drug use: No  . Sexual activity: Yes    Partners: Female   Other Topics Concern  . Not on file   Social History Narrative   Lives with wife (she moved to [place] and will see less often over the next year), 2 dogs.  Son lives in [place], daughter in [place], teaching at [employer]--he takes care of their two grandsons     Family History  Problem Relation Age of Onset  . Diabetes Mother   . Heart disease Mother     angioplasty in her 27's  . Pulmonary fibrosis Mother   . Hypertension Mother   . Macular degeneration Mother   . Stroke Brother 48  . Crohn's disease Son     dx'd at age 52 months  . Cancer Maternal Grandmother 79    colon cancer  . Macular degeneration Maternal Aunt   . Diabetes Maternal Aunt   . Macular degeneration Maternal Uncle   . Diabetes Maternal Uncle   . Cancer Maternal Uncle     prostate cancer  . Macular degeneration Maternal Aunt   .  Diabetes Maternal Aunt     Outpatient Encounter Prescriptions as of 06/27/2016  Medication Sig Note  . Multiple Vitamins-Minerals (PRESERVISION AREDS 2 PO) Take 2 capsules by mouth daily.   . Naproxen Sod-Diphenhydramine (ALEVE PM PO) Take 2 tablets by mouth at bedtime. 06/27/2016: Most nights-5 out of 7   No facility-administered encounter medications on file as of 06/27/2016.     No Known Allergies  ROS: The patient denies anorexia, fever, headaches, ear pain, hoarseness, chest pain, dizziness, syncope, dyspnea on exertion, cough, swelling, nausea, vomiting, diarrhea, constipation, abdominal pain, melena, hematochezia, hematuria, incontinence, dysuria, genital lesions, joint pains, numbness, tingling, weakness, tremor, suspicious skin lesions, depression, anxiety, abnormal bleeding/bruising, or enlarged lymph  nodes. +hearing loss--wears hearing aids. Tinnitus bilaterally (chronic) --thinks the tinnitus and hearing loss has gotten worse recently.  +erectile dysfunction. Cialis is effective +Loss of hair on both lower legs; hair at knees.  Unchanged from last few years  He has some residual pain at his right shoulder, from biceps tendinitis (improving). +trouble staying asleep past 5:30-6 (goes to bed 11-11:30). He occasionally naps in the afternoon.  He slept very well when hiking/camping, not as well at home. Takes Aleve PM most nights, and it helps; unsure if coffee is a factor. Gets woken up with shoulder numbness at night. Down 5# from physical last year Some discomfort at his ankles and behind his right knee since hiking on the trails. No discomfort with running/walking Skin irritation on bottom as per HPI   PHYSICAL EXAM:  BP 130/76   Pulse (!) 56   Ht 6' 0.5" (1.842 m)   Wt 225 lb 12.8 oz (102.4 kg)   BMI 30.20 kg/m    General Appearance:  Alert, cooperative, no distress, appears stated age   Head:  Normocephalic, without obvious abnormality, atraumatic   Eyes:  PERRL,  conjunctiva/corneas clear, EOM's intact, fundi  benign   Ears:  Wearing hearing aids bilaterally; TM's and EACs are normal upon their removal  Nose:  Nares normal, nasal mucosa with moderate edema bilaterally; no sinus tenderness   Throat:  Lips, mucosa, and tongue normal; teeth and gums normal   Neck:  Supple, no lymphadenopathy; thyroid: no enlargement/tenderness/nodules; no carotid  bruit or JVD   Back:  Spine nontender, no curvature, ROM normal, no CVA tenderness   Lungs:  Clear to auscultation bilaterally without wheezes, rales or ronchi; respirations unlabored   Chest Wall:  No tenderness or deformity   Heart:  Bradycardic, regular rhythm, S1 and S2 normal, no murmur, rub  or gallop   Breast Exam:  No chest wall tenderness, masses or gynecomastia   Abdomen:  Soft, non-tender, nondistended, normoactive bowel sounds,  no masses, no hepatosplenomegaly. +abdominal obesity  Genitalia:  Normal male external genitalia without lesions. Testicles without masses. No inguinal hernias.   Rectal:  Normal sphincter tone, no masses or tenderness; guaiac negative stool. Prostate smooth, no nodules, not enlarged. Slightly inflamed, nontender external hemorrhoid  Extremities:  No clubbing, cyanosis or edema.   Pulses:  2+ and symmetric all extremities   Skin:  Skin color, texture, turgor normal. Decreased hair on lower extremities bilaterally. Right side of gluteal cleft there is a slight rash--central abrasion with some flaking. Very slight flaking on the left.  No induration, warmth, crusting or soft tissue swelling. 1.25-1.5 cm subcutanous, mobile soft tissue mass at the left upper temple area.  It is flesh colored, soft, mobile, no erythema.  At the 7 o'clock position there is a small (98m) flesh-colored to slightly pink raised bump.  There is no drainage, pustule, crusting, erythema or warmth. Nontender. There is a flat brown pigmented macular lesion off to the right  side, next to the raised area. Unchanged since January  Lymph nodes:  Cervical, supraclavicular, and axillary nodes normal   Neurologic:  CNII-XII intact, normal strength, sensation and gait; reflexes 2+ and symmetric throughout    Psych:  Normal mood, affect, hygiene and grooming   EKG: sinus bradycardia, rate of 54    ASSESSMENT/PLAN:  Medicare annual wellness visit, initial  Dyslipidemia - continue lowfat, low cholesterol diet. Compare to last year (when taking fish oil and niacin) - Plan: Lipid panel  BPH (benign prostatic hyperplasia) -  resolved; asymptomatic off medication  Essential hypertension, benign - history--BP normal today, not monitored elsewhere.  check EKG as baseline. Continue low sodium diet, exercise, weight loss - Plan: Comprehensive metabolic panel, EKG [id number]  Gastroesophageal reflux disease, esophagitis presence not specified - continue proper diet, prn use of PPI; refer if ongoing dysphagia  Obesity (BMI 30.0-34.9) - further weight loss encouraged  Prediabetes - continue regular exercise, lowfat/carb diet, weight loss encouraged - Plan: Comprehensive metabolic panel, Hemoglobin A1c  Screening for prostate cancer - Plan: PSA, Medicare  Immunization due - Plan: Pneumococcal conjugate vaccine 13-valent  Medication monitoring encounter - Plan: Lipid panel, Comprehensive metabolic panel, CBC with Differential/Platelet  Need for hepatitis C screening test - Plan: Hepatitis C antibody  Erectile dysfunction, unspecified erectile dysfunction type - Plan: tadalafil (CIALIS) 20 MG tablet   Prevnar-13 c-met, lipids, CBC, PSA and A1c (at lab) Hepatitis C screen Baseline EKG--h/o HTN  Discussed PSA screening (risks/benefits), recommended at least 30 minutes of aerobic activity at least 5 days/week, weight-bearing exercise 2x/week; proper sunscreen use reviewed; healthy diet and alcohol recommendations (less than or equal to 2 drinks/day) reviewed;  regular seatbelt use; changing batteries in smoke detectors. Self-testicular exams. Immunization recommendations discussed--UTD. Prevnar-13 today. Colonoscopy recommendations reviewed--UTD  Skin irritation near gluteal fold--discussed use of antibacterial ointment (vs barrier such as desitin), and expect healing/resolution.  No evidence of yeast or bacterial infection.  If you are consistently having some trouble swallowing, use the prilosec twice daily for a few weeks to see if this resolves.  If ongoing problems swallowing, additional evaluation may be needed.  Discussed getting copies of Living Will and Healthcare POA sent to [place]. He doesn't want any prolonged measures.  Full Code, Full Care  He will be moving to [place] in <1 year, so f/u not scheduled. Will need pneumovax next year.   Medicare Attestation I have personally reviewed: The patient's medical and social history Their use of alcohol, tobacco or illicit drugs Their current medications and supplements The patient's functional ability including ADLs,fall risks, home safety risks, cognitive, and hearing and visual impairment Diet and physical activities Evidence for depression or mood disorders  The patient's weight, height, and BMI have been recorded in the chart.  I have made referrals, counseling, and provided education to the patient based on review of the above and I have provided the patient with a written personalized care plan for preventive services.     Shena Vinluan A, MD   06/26/2016

## 2016-06-27 ENCOUNTER — Encounter: Payer: Self-pay | Admitting: Family Medicine

## 2016-06-27 ENCOUNTER — Ambulatory Visit (INDEPENDENT_AMBULATORY_CARE_PROVIDER_SITE_OTHER): Payer: Medicare Other | Admitting: Family Medicine

## 2016-06-27 VITALS — BP 130/76 | HR 56 | Ht 72.5 in | Wt 225.8 lb

## 2016-06-27 DIAGNOSIS — N529 Male erectile dysfunction, unspecified: Secondary | ICD-10-CM

## 2016-06-27 DIAGNOSIS — K219 Gastro-esophageal reflux disease without esophagitis: Secondary | ICD-10-CM | POA: Diagnosis not present

## 2016-06-27 DIAGNOSIS — I10 Essential (primary) hypertension: Secondary | ICD-10-CM

## 2016-06-27 DIAGNOSIS — E785 Hyperlipidemia, unspecified: Secondary | ICD-10-CM | POA: Diagnosis not present

## 2016-06-27 DIAGNOSIS — Z23 Encounter for immunization: Secondary | ICD-10-CM

## 2016-06-27 DIAGNOSIS — Z125 Encounter for screening for malignant neoplasm of prostate: Secondary | ICD-10-CM | POA: Diagnosis not present

## 2016-06-27 DIAGNOSIS — Z0000 Encounter for general adult medical examination without abnormal findings: Secondary | ICD-10-CM | POA: Diagnosis not present

## 2016-06-27 DIAGNOSIS — N400 Enlarged prostate without lower urinary tract symptoms: Secondary | ICD-10-CM

## 2016-06-27 DIAGNOSIS — Z1159 Encounter for screening for other viral diseases: Secondary | ICD-10-CM

## 2016-06-27 DIAGNOSIS — E669 Obesity, unspecified: Secondary | ICD-10-CM | POA: Diagnosis not present

## 2016-06-27 DIAGNOSIS — Z5181 Encounter for therapeutic drug level monitoring: Secondary | ICD-10-CM

## 2016-06-27 DIAGNOSIS — R7303 Prediabetes: Secondary | ICD-10-CM

## 2016-06-27 LAB — COMPREHENSIVE METABOLIC PANEL
ALT: 16 U/L (ref 9–46)
AST: 20 U/L (ref 10–35)
Albumin: 4 g/dL (ref 3.6–5.1)
Alkaline Phosphatase: 82 U/L (ref 40–115)
BILIRUBIN TOTAL: 0.4 mg/dL (ref 0.2–1.2)
BUN: 21 mg/dL (ref 7–25)
CALCIUM: 8.6 mg/dL (ref 8.6–10.3)
CO2: 28 mmol/L (ref 20–31)
Chloride: 106 mmol/L (ref 98–110)
Creat: 0.93 mg/dL (ref 0.70–1.25)
Glucose, Bld: 105 mg/dL — ABNORMAL HIGH (ref 65–99)
POTASSIUM: 4.8 mmol/L (ref 3.5–5.3)
Sodium: 142 mmol/L (ref 135–146)
TOTAL PROTEIN: 6.4 g/dL (ref 6.1–8.1)

## 2016-06-27 LAB — CBC WITH DIFFERENTIAL/PLATELET
BASOS ABS: 0 {cells}/uL (ref 0–200)
Basophils Relative: 0 %
Eosinophils Absolute: 162 cells/uL (ref 15–500)
Eosinophils Relative: 3 %
HEMATOCRIT: 42 % (ref 38.5–50.0)
HEMOGLOBIN: 14 g/dL (ref 13.2–17.1)
Lymphocytes Relative: 21 %
Lymphs Abs: 1134 cells/uL (ref 850–3900)
MCH: 30.8 pg (ref 27.0–33.0)
MCHC: 33.3 g/dL (ref 32.0–36.0)
MCV: 92.5 fL (ref 80.0–100.0)
MPV: 9.7 fL (ref 7.5–12.5)
Monocytes Absolute: 540 cells/uL (ref 200–950)
Monocytes Relative: 10 %
NEUTROS PCT: 66 %
Neutro Abs: 3564 cells/uL (ref 1500–7800)
Platelets: 173 10*3/uL (ref 140–400)
RBC: 4.54 MIL/uL (ref 4.20–5.80)
RDW: 12.9 % (ref 11.0–15.0)
WBC: 5.4 10*3/uL (ref 4.0–10.5)

## 2016-06-27 LAB — LIPID PANEL
CHOLESTEROL: 181 mg/dL (ref 125–200)
HDL: 47 mg/dL (ref >=40)
LDL Cholesterol: 114 mg/dL (ref <130)
Total CHOL/HDL Ratio: 3.9 Ratio (ref <=5.0)
Triglycerides: 99 mg/dL (ref <150)
VLDL: 20 mg/dL (ref <30)

## 2016-06-27 MED ORDER — TADALAFIL 20 MG PO TABS: 10.0000 mg | ORAL_TABLET | ORAL | 11 refills | Status: DC | PRN

## 2016-06-27 NOTE — Patient Instructions (Signed)
HEALTH MAINTENANCE RECOMMENDATIONS:  It is recommended that you get at least 30 minutes of aerobic exercise at least 5 days/week (for weight loss, you may need as much as 60-90 minutes). This can be any activity that gets your heart rate up. This can be divided in 10-15 minute intervals if needed, but try and build up your endurance at least once a week.  Weight bearing exercise is also recommended twice weekly.  Eat a healthy diet with lots of vegetables, fruits and fiber.  "Colorful" foods have a lot of vitamins (ie green vegetables, tomatoes, red peppers, etc).  Limit sweet tea, regular sodas and alcoholic beverages, all of which has a lot of calories and sugar.  Up to 2 alcoholic drinks daily may be beneficial for men (unless trying to lose weight, watch sugars).  Drink a lot of water.  Sunscreen of at least SPF 30 should be used on all sun-exposed parts of the skin when outside between the hours of 10 am and 4 pm (not just when at beach or pool, but even with exercise, golf, tennis, and yard work!)  Use a sunscreen that says "broad spectrum" so it covers both UVA and UVB rays, and make sure to reapply every 1-2 hours.  Remember to change the batteries in your smoke detectors when changing your clock times in the spring and fall.  Use your seat belt every time you are in a car, and please drive safely and not be distracted with cell phones and texting while driving.    Dillon Caldwell , Thank you for taking time to come for your Medicare Wellness Visit. I appreciate your ongoing commitment to your health goals. Please review the following plan we discussed and let me know if I can assist you in the future.   These are the goals we discussed: Goals    None      This is a list of the screening recommended for you and due dates:  Health Maintenance  Topic Date Due  .  Hepatitis C: One time screening is recommended by Center for Disease Control  (CDC) for  adults born from [birth year] through 1966.    01-20-1951  . Pneumonia vaccines (1 of 2 - PCV13) 02/21/2015  . Flu Shot  06/26/2016  . Colon Cancer Screening  11/26/2017  . Tetanus Vaccine  01/01/2022  . Shingles Vaccine  Completed   We are doing the hepatitis C screening today. You got the first pneumonia vaccine (prevnar) today.  You will need pneumovax next year.  Insomnia Insomnia is a sleep disorder that makes it difficult to fall asleep or to stay asleep. Insomnia can cause tiredness (fatigue), low energy, difficulty concentrating, mood swings, and poor performance at work or school.  There are three different ways to classify insomnia:  Difficulty falling asleep.  Difficulty staying asleep.  Waking up too early in the morning. Any type of insomnia can be long-term (chronic) or short-term (acute). Both are common. Short-term insomnia usually lasts for three months or less. Chronic insomnia occurs at least three times a week for longer than three months. CAUSES  Insomnia may be caused by another condition, situation, or substance, such as:  Anxiety.  Certain medicines.  Gastroesophageal reflux disease (GERD) or other gastrointestinal conditions.  Asthma or other breathing conditions.  Restless legs syndrome, sleep apnea, or other sleep disorders.  Chronic pain.  Menopause. This may include hot flashes.  Stroke.  Abuse of alcohol, tobacco, or illegal drugs.  Depression.  Caffeine.  Neurological disorders, such as Alzheimer disease.  An overactive thyroid (hyperthyroidism). The cause of insomnia may not be known. RISK FACTORS Risk factors for insomnia include:  Gender. Women are more commonly affected than men.  Age. Insomnia is more common as you get older.  Stress. This may involve your professional or personal life.  Income. Insomnia is more common in people with lower income.  Lack of exercise.   Irregular work schedule or night shifts.  Traveling between different time zones. SIGNS AND  SYMPTOMS If you have insomnia, trouble falling asleep or trouble staying asleep is the main symptom. This may lead to other symptoms, such as:  Feeling fatigued.  Feeling nervous about going to sleep.  Not feeling rested in the morning.  Having trouble concentrating.  Feeling irritable, anxious, or depressed. TREATMENT  Treatment for insomnia depends on the cause. If your insomnia is caused by an underlying condition, treatment will focus on addressing the condition. Treatment may also include:   Medicines to help you sleep.  Counseling or therapy.  Lifestyle adjustments. HOME CARE INSTRUCTIONS   Take medicines only as directed by your health care provider.  Keep regular sleeping and waking hours. Avoid naps.  Keep a sleep diary to help you and your health care provider figure out what could be causing your insomnia. Include:   When you sleep.  When you wake up during the night.  How well you sleep.   How rested you feel the next day.  Any side effects of medicines you are taking.  What you eat and drink.   Make your bedroom a comfortable place where it is easy to fall asleep:  Put up shades or special blackout curtains to block light from outside.  Use a white noise machine to block noise.  Keep the temperature cool.   Exercise regularly as directed by your health care provider. Avoid exercising right before bedtime.  Use relaxation techniques to manage stress. Ask your health care provider to suggest some techniques that may work well for you. These may include:  Breathing exercises.  Routines to release muscle tension.  Visualizing peaceful scenes.  Cut back on alcohol, caffeinated beverages, and cigarettes, especially close to bedtime. These can disrupt your sleep.  Do not overeat or eat spicy foods right before bedtime. This can lead to digestive discomfort that can make it hard for you to sleep.  Limit screen use before bedtime. This  includes:  Watching TV.  Using your smartphone, tablet, and computer.  Stick to a routine. This can help you fall asleep faster. Try to do a quiet activity, brush your teeth, and go to bed at the same time each night.  Get out of bed if you are still awake after 15 minutes of trying to sleep. Keep the lights down, but try reading or doing a quiet activity. When you feel sleepy, go back to bed.  Make sure that you drive carefully. Avoid driving if you feel very sleepy.  Keep all follow-up appointments as directed by your health care provider. This is important. SEEK MEDICAL CARE IF:   You are tired throughout the day or have trouble in your daily routine due to sleepiness.  You continue to have sleep problems or your sleep problems get worse. SEEK IMMEDIATE MEDICAL CARE IF:   You have serious thoughts about hurting yourself or someone else.   This information is not intended to replace advice given to you by your health care provider. Make sure you discuss any  questions you have with your health care provider.   Document Released: 11/09/2000 Document Revised: 08/03/2015 Document Reviewed: 08/13/2014 Elsevier Interactive Patient Education [employer].

## 2016-06-28 LAB — HEMOGLOBIN A1C
HEMOGLOBIN A1C: 6.1 % — AB (ref <5.7)
Mean Plasma Glucose: 128 mg/dL

## 2016-06-28 LAB — PSA, MEDICARE: PSA: 1.45 ng/mL (ref <=4.00)

## 2016-06-28 LAB — HEPATITIS C ANTIBODY: HCV Ab: NEGATIVE

## 2016-10-04 DIAGNOSIS — Z23 Encounter for immunization: Secondary | ICD-10-CM | POA: Diagnosis not present

## 2017-02-11 ENCOUNTER — Ambulatory Visit (INDEPENDENT_AMBULATORY_CARE_PROVIDER_SITE_OTHER): Payer: Medicare Other | Admitting: Ophthalmology

## 2017-03-21 ENCOUNTER — Ambulatory Visit (INDEPENDENT_AMBULATORY_CARE_PROVIDER_SITE_OTHER): Payer: Medicare Other | Admitting: Ophthalmology

## 2017-03-21 DIAGNOSIS — I10 Essential (primary) hypertension: Secondary | ICD-10-CM

## 2017-03-21 DIAGNOSIS — H35033 Hypertensive retinopathy, bilateral: Secondary | ICD-10-CM

## 2017-03-21 DIAGNOSIS — H2513 Age-related nuclear cataract, bilateral: Secondary | ICD-10-CM

## 2017-03-21 DIAGNOSIS — H3553 Other dystrophies primarily involving the sensory retina: Secondary | ICD-10-CM | POA: Diagnosis not present

## 2017-03-21 DIAGNOSIS — H43813 Vitreous degeneration, bilateral: Secondary | ICD-10-CM

## 2017-04-04 ENCOUNTER — Encounter: Payer: Self-pay | Admitting: Family Medicine

## 2017-04-04 ENCOUNTER — Ambulatory Visit (INDEPENDENT_AMBULATORY_CARE_PROVIDER_SITE_OTHER): Payer: Medicare Other | Admitting: Family Medicine

## 2017-04-04 VITALS — BP 110/72 | HR 56 | Temp 98.4°F | Ht 72.5 in | Wt 237.4 lb

## 2017-04-04 DIAGNOSIS — J301 Allergic rhinitis due to pollen: Secondary | ICD-10-CM | POA: Diagnosis not present

## 2017-04-04 DIAGNOSIS — J069 Acute upper respiratory infection, unspecified: Secondary | ICD-10-CM

## 2017-04-04 MED ORDER — AMOXICILLIN 500 MG PO TABS: 1000.0000 mg | ORAL_TABLET | Freq: Two times a day (BID) | ORAL | 0 refills | Status: DC

## 2017-04-04 NOTE — Progress Notes (Signed)
Chief Complaint  Patient presents with  . Cough    thinks it started at allergies that morphed into an URI. Getting some mucus from chest that is "taupe" in color. HA's. Temp was 101 last night. No facial pain or ear pain. Some chills and body aches and was sweating "like a pig" last night.     Allergies started flaring last weekend when he mowed the yard.  Sneezing, runny nose, but has now turned into cough.  Yellowish-brownish phlegm is expectorated, but mucus from the nose is clear.  Cough is productive all day.  Sleeping okay, cough isn't keeping him awake.  T101 last night. +chest congestion, heaviness. No sinus pain. Had headache with fever/chills last night.  Throat hurts only with cough.  Denies shortness of breath. Possible sick contact--grandson with similar sounding cough, but no fever, not acting sick.  He started daily Flonase and zyrtec three days ago.  Taking a cough syrup that he had to show his license for, containing DM. Supposed to last 12 hours. Isn't helping much.  PMH, PSH, SH reviewed   Outpatient Encounter Prescriptions as of 04/04/2017  Medication Sig Note  . brompheniramine-pseudoephedrine-DM 30-2-10 MG/5ML syrup Take 5 mLs by mouth 4 (four) times daily as needed.   . cetirizine (ZYRTEC) 10 MG tablet Take 10 mg by mouth daily.   . fluticasone (FLONASE) 50 MCG/ACT nasal spray Place 1 spray into both nostrils daily.   . Multiple Vitamins-Minerals (PRESERVISION AREDS 2 PO) Take 2 capsules by mouth daily.   . Naproxen Sod-Diphenhydramine (ALEVE PM PO) Take 2 tablets by mouth at bedtime. 06/27/2016: Most nights-5 out of 7  . tadalafil (CIALIS) 20 MG tablet Take 0.5-1 tablets (10-20 mg total) by mouth every other day as needed for erectile dysfunction.    No facility-administered encounter medications on file as of 04/04/2017.    No tylenol this morning. Just cough medication, flonase and zyrtec.  No Known Allergies  ROS:  Fever, URI and cough per HPI.  Denies  bleeding, bruising, rashes.  No known tick bites.  No nausea, vomiting, diarrhea, chest pain, shortness of breath or other concerns.   PHYSICAL EXAM:  BP 110/72 (BP Location: Left Arm, Patient Position: Sitting, Cuff Size: Normal)   Pulse (!) 56   Temp 98.4 F (36.9 C) (Tympanic)   Ht 6' 0.5" (1.842 m)   Wt 237 lb 6.4 oz (107.7 kg)   BMI 31.75 kg/m    Well appearing male in no distress. No coughing during visit. HEENT: PERRL, EOMI, conjunctiva and sclera are clear. Nasal mucosa mildly edematous, no purulence or erythema. Sinuses nontender.  OP is clear Neck: no lymphadenopathy or mass Heart: regular rate and rhythm without murmur Lungs: clear bilaterally--no wheezes, rales, ronchi Skin: normal turgor, no rash Psych: normal mood, affect, hygiene and grooming Neuro: alert and oriented, cranial nerves intact, normal strength, gait  ASSESSMENT/PLAN:   Upper respiratory tract infection, unspecified type - suspect virus, vs early sinusitis.  Start ABX only if symptoms persist/worsen. Supportive measures reviewed - Plan: amoxicillin (AMOXIL) 500 MG tablet  Seasonal allergic rhinitis due to pollen - continue flonase, zyrtec    Continue the flonase and zyrtec for allergies. Continue tylenol as needed for fever Drink plenty of fluids Check your cough syrup--if it does not contain guaifenesin, then you may take mucinex tablets in addition to the syrup (get the 12 hour tablets). Contact me for a prescription for benzonatate (tessalon) if the cough is getting more bothersome. I suspect this may be  a virus--it hard to tell apart from a very early sinus infection. I would hold off on starting the antibiotics for another few days.  Start them if fever persists, or worsening symptoms.

## 2017-04-04 NOTE — Patient Instructions (Signed)
  Continue the flonase and zyrtec for allergies. Continue tylenol as needed for fever Drink plenty of fluids Check your cough syrup--if it does not contain guaifenesin, then you may take mucinex tablets in addition to the syrup (get the 12 hour tablets). Contact me for a prescription for benzonatate (tessalon) if the cough is getting more bothersome. I suspect this may be a virus--it hard to tell apart from a very early sinus infection. I would hold off on starting the antibiotics for another few days.  Start them if fever persists, or worsening symptoms.

## 2017-06-27 DIAGNOSIS — M25512 Pain in left shoulder: Secondary | ICD-10-CM | POA: Diagnosis not present

## 2017-07-02 DIAGNOSIS — M25512 Pain in left shoulder: Secondary | ICD-10-CM | POA: Diagnosis not present

## 2017-07-02 DIAGNOSIS — S40012A Contusion of left shoulder, initial encounter: Secondary | ICD-10-CM | POA: Diagnosis not present

## 2017-07-18 DIAGNOSIS — M25512 Pain in left shoulder: Secondary | ICD-10-CM | POA: Diagnosis not present

## 2017-07-18 DIAGNOSIS — M7522 Bicipital tendinitis, left shoulder: Secondary | ICD-10-CM | POA: Diagnosis not present

## 2017-07-18 DIAGNOSIS — S40012D Contusion of left shoulder, subsequent encounter: Secondary | ICD-10-CM | POA: Diagnosis not present

## 2017-11-11 DIAGNOSIS — L723 Sebaceous cyst: Secondary | ICD-10-CM | POA: Diagnosis not present

## 2017-11-11 DIAGNOSIS — Z13220 Encounter for screening for lipoid disorders: Secondary | ICD-10-CM | POA: Diagnosis not present

## 2017-11-11 DIAGNOSIS — Z13228 Encounter for screening for other metabolic disorders: Secondary | ICD-10-CM | POA: Diagnosis not present

## 2017-11-11 DIAGNOSIS — M25512 Pain in left shoulder: Secondary | ICD-10-CM | POA: Diagnosis not present

## 2017-11-11 DIAGNOSIS — R030 Elevated blood-pressure reading, without diagnosis of hypertension: Secondary | ICD-10-CM | POA: Diagnosis not present

## 2017-11-11 DIAGNOSIS — M79641 Pain in right hand: Secondary | ICD-10-CM | POA: Diagnosis not present

## 2017-11-11 DIAGNOSIS — Z0001 Encounter for general adult medical examination with abnormal findings: Secondary | ICD-10-CM | POA: Diagnosis not present

## 2017-11-11 DIAGNOSIS — R7309 Other abnormal glucose: Secondary | ICD-10-CM | POA: Diagnosis not present

## 2017-11-11 DIAGNOSIS — Z125 Encounter for screening for malignant neoplasm of prostate: Secondary | ICD-10-CM | POA: Diagnosis not present

## 2017-12-09 DIAGNOSIS — M79641 Pain in right hand: Secondary | ICD-10-CM | POA: Diagnosis not present

## 2017-12-09 DIAGNOSIS — M19041 Primary osteoarthritis, right hand: Secondary | ICD-10-CM | POA: Diagnosis not present

## 2017-12-11 DIAGNOSIS — M25512 Pain in left shoulder: Secondary | ICD-10-CM | POA: Diagnosis not present

## 2017-12-18 DIAGNOSIS — M25612 Stiffness of left shoulder, not elsewhere classified: Secondary | ICD-10-CM | POA: Diagnosis not present

## 2017-12-18 DIAGNOSIS — R030 Elevated blood-pressure reading, without diagnosis of hypertension: Secondary | ICD-10-CM | POA: Diagnosis not present

## 2017-12-18 DIAGNOSIS — S40012D Contusion of left shoulder, subsequent encounter: Secondary | ICD-10-CM | POA: Diagnosis not present

## 2017-12-18 DIAGNOSIS — M25512 Pain in left shoulder: Secondary | ICD-10-CM | POA: Diagnosis not present

## 2017-12-18 DIAGNOSIS — M62512 Muscle wasting and atrophy, not elsewhere classified, left shoulder: Secondary | ICD-10-CM | POA: Diagnosis not present

## 2017-12-20 DIAGNOSIS — M25612 Stiffness of left shoulder, not elsewhere classified: Secondary | ICD-10-CM | POA: Diagnosis not present

## 2017-12-20 DIAGNOSIS — M25512 Pain in left shoulder: Secondary | ICD-10-CM | POA: Diagnosis not present

## 2017-12-20 DIAGNOSIS — M62512 Muscle wasting and atrophy, not elsewhere classified, left shoulder: Secondary | ICD-10-CM | POA: Diagnosis not present

## 2017-12-25 DIAGNOSIS — M62512 Muscle wasting and atrophy, not elsewhere classified, left shoulder: Secondary | ICD-10-CM | POA: Diagnosis not present

## 2017-12-25 DIAGNOSIS — M25512 Pain in left shoulder: Secondary | ICD-10-CM | POA: Diagnosis not present

## 2017-12-25 DIAGNOSIS — M25612 Stiffness of left shoulder, not elsewhere classified: Secondary | ICD-10-CM | POA: Diagnosis not present

## 2018-01-01 DIAGNOSIS — M25612 Stiffness of left shoulder, not elsewhere classified: Secondary | ICD-10-CM | POA: Diagnosis not present

## 2018-01-01 DIAGNOSIS — M25512 Pain in left shoulder: Secondary | ICD-10-CM | POA: Diagnosis not present

## 2018-01-01 DIAGNOSIS — M62512 Muscle wasting and atrophy, not elsewhere classified, left shoulder: Secondary | ICD-10-CM | POA: Diagnosis not present

## 2018-01-08 DIAGNOSIS — M25512 Pain in left shoulder: Secondary | ICD-10-CM | POA: Diagnosis not present

## 2018-01-08 DIAGNOSIS — S40012D Contusion of left shoulder, subsequent encounter: Secondary | ICD-10-CM | POA: Diagnosis not present

## 2018-01-09 DIAGNOSIS — S40012D Contusion of left shoulder, subsequent encounter: Secondary | ICD-10-CM | POA: Diagnosis not present

## 2018-01-09 DIAGNOSIS — M25512 Pain in left shoulder: Secondary | ICD-10-CM | POA: Diagnosis not present

## 2018-01-09 DIAGNOSIS — M7522 Bicipital tendinitis, left shoulder: Secondary | ICD-10-CM | POA: Diagnosis not present

## 2018-01-15 DIAGNOSIS — S46012A Strain of muscle(s) and tendon(s) of the rotator cuff of left shoulder, initial encounter: Secondary | ICD-10-CM | POA: Diagnosis not present

## 2018-01-15 DIAGNOSIS — S40012D Contusion of left shoulder, subsequent encounter: Secondary | ICD-10-CM | POA: Diagnosis not present

## 2018-01-20 DIAGNOSIS — M75122 Complete rotator cuff tear or rupture of left shoulder, not specified as traumatic: Secondary | ICD-10-CM | POA: Diagnosis not present

## 2018-01-20 DIAGNOSIS — S40012D Contusion of left shoulder, subsequent encounter: Secondary | ICD-10-CM | POA: Diagnosis not present

## 2018-01-20 DIAGNOSIS — M25512 Pain in left shoulder: Secondary | ICD-10-CM | POA: Diagnosis not present

## 2018-01-20 DIAGNOSIS — M7522 Bicipital tendinitis, left shoulder: Secondary | ICD-10-CM | POA: Diagnosis not present

## 2018-01-24 DIAGNOSIS — S43431D Superior glenoid labrum lesion of right shoulder, subsequent encounter: Secondary | ICD-10-CM | POA: Diagnosis not present

## 2018-01-24 DIAGNOSIS — M75122 Complete rotator cuff tear or rupture of left shoulder, not specified as traumatic: Secondary | ICD-10-CM | POA: Diagnosis not present

## 2018-01-24 DIAGNOSIS — S43432A Superior glenoid labrum lesion of left shoulder, initial encounter: Secondary | ICD-10-CM | POA: Diagnosis not present

## 2018-01-24 DIAGNOSIS — E669 Obesity, unspecified: Secondary | ICD-10-CM | POA: Diagnosis not present

## 2018-01-24 DIAGNOSIS — G8918 Other acute postprocedural pain: Secondary | ICD-10-CM | POA: Diagnosis not present

## 2018-01-24 DIAGNOSIS — S43402A Unspecified sprain of left shoulder joint, initial encounter: Secondary | ICD-10-CM | POA: Diagnosis not present

## 2018-01-24 DIAGNOSIS — M75102 Unspecified rotator cuff tear or rupture of left shoulder, not specified as traumatic: Secondary | ICD-10-CM | POA: Diagnosis not present

## 2018-01-24 HISTORY — PX: ROTATOR CUFF REPAIR: SHX139

## 2018-01-28 DIAGNOSIS — M62512 Muscle wasting and atrophy, not elsewhere classified, left shoulder: Secondary | ICD-10-CM | POA: Diagnosis not present

## 2018-01-28 DIAGNOSIS — M25512 Pain in left shoulder: Secondary | ICD-10-CM | POA: Diagnosis not present

## 2018-01-28 DIAGNOSIS — M25612 Stiffness of left shoulder, not elsewhere classified: Secondary | ICD-10-CM | POA: Diagnosis not present

## 2018-01-28 DIAGNOSIS — Z4789 Encounter for other orthopedic aftercare: Secondary | ICD-10-CM | POA: Diagnosis not present

## 2018-01-30 DIAGNOSIS — M7702 Medial epicondylitis, left elbow: Secondary | ICD-10-CM | POA: Diagnosis not present

## 2018-01-30 DIAGNOSIS — M25522 Pain in left elbow: Secondary | ICD-10-CM | POA: Diagnosis not present

## 2018-02-03 DIAGNOSIS — Z4789 Encounter for other orthopedic aftercare: Secondary | ICD-10-CM | POA: Diagnosis not present

## 2018-02-04 DIAGNOSIS — M25612 Stiffness of left shoulder, not elsewhere classified: Secondary | ICD-10-CM | POA: Diagnosis not present

## 2018-02-04 DIAGNOSIS — M25512 Pain in left shoulder: Secondary | ICD-10-CM | POA: Diagnosis not present

## 2018-02-04 DIAGNOSIS — M62512 Muscle wasting and atrophy, not elsewhere classified, left shoulder: Secondary | ICD-10-CM | POA: Diagnosis not present

## 2018-02-07 DIAGNOSIS — M25612 Stiffness of left shoulder, not elsewhere classified: Secondary | ICD-10-CM | POA: Diagnosis not present

## 2018-02-07 DIAGNOSIS — M62512 Muscle wasting and atrophy, not elsewhere classified, left shoulder: Secondary | ICD-10-CM | POA: Diagnosis not present

## 2018-02-07 DIAGNOSIS — M25521 Pain in right elbow: Secondary | ICD-10-CM | POA: Diagnosis not present

## 2018-02-07 DIAGNOSIS — Z4789 Encounter for other orthopedic aftercare: Secondary | ICD-10-CM | POA: Diagnosis not present

## 2018-02-14 DIAGNOSIS — M25612 Stiffness of left shoulder, not elsewhere classified: Secondary | ICD-10-CM | POA: Diagnosis not present

## 2018-02-14 DIAGNOSIS — M62512 Muscle wasting and atrophy, not elsewhere classified, left shoulder: Secondary | ICD-10-CM | POA: Diagnosis not present

## 2018-02-14 DIAGNOSIS — M25512 Pain in left shoulder: Secondary | ICD-10-CM | POA: Diagnosis not present

## 2018-02-18 DIAGNOSIS — M25512 Pain in left shoulder: Secondary | ICD-10-CM | POA: Diagnosis not present

## 2018-02-18 DIAGNOSIS — M62512 Muscle wasting and atrophy, not elsewhere classified, left shoulder: Secondary | ICD-10-CM | POA: Diagnosis not present

## 2018-02-18 DIAGNOSIS — M25612 Stiffness of left shoulder, not elsewhere classified: Secondary | ICD-10-CM | POA: Diagnosis not present

## 2018-02-25 DIAGNOSIS — M62512 Muscle wasting and atrophy, not elsewhere classified, left shoulder: Secondary | ICD-10-CM | POA: Diagnosis not present

## 2018-02-25 DIAGNOSIS — M25612 Stiffness of left shoulder, not elsewhere classified: Secondary | ICD-10-CM | POA: Diagnosis not present

## 2018-03-04 DIAGNOSIS — Z4789 Encounter for other orthopedic aftercare: Secondary | ICD-10-CM | POA: Diagnosis not present

## 2018-03-04 DIAGNOSIS — M62512 Muscle wasting and atrophy, not elsewhere classified, left shoulder: Secondary | ICD-10-CM | POA: Diagnosis not present

## 2018-03-04 DIAGNOSIS — M25512 Pain in left shoulder: Secondary | ICD-10-CM | POA: Diagnosis not present

## 2018-03-05 DIAGNOSIS — M79641 Pain in right hand: Secondary | ICD-10-CM | POA: Diagnosis not present

## 2018-03-11 DIAGNOSIS — M25512 Pain in left shoulder: Secondary | ICD-10-CM | POA: Diagnosis not present

## 2018-03-11 DIAGNOSIS — Z4789 Encounter for other orthopedic aftercare: Secondary | ICD-10-CM | POA: Diagnosis not present

## 2018-08-20 LAB — HM COLONOSCOPY

## 2019-12-22 LAB — PSA: PSA: 1.69

## 2019-12-22 LAB — HEMOGLOBIN A1C: Hemoglobin A1C: 6.1

## 2020-02-11 ENCOUNTER — Telehealth: Payer: Self-pay

## 2020-02-11 NOTE — Telephone Encounter (Signed)
Of course! Just have them send records from the PCP's they had there. I'm looking forward to seeing them again!

## 2020-02-11 NOTE — Telephone Encounter (Signed)
Pt. Called stating he and his wife used to be pts. Of yours until they moved to [place], they are both moving back to [place] and wanted to know if you would take them both back as pts. His wife's name is [name] and they were both last seen in 2018.

## 2020-02-12 NOTE — Telephone Encounter (Signed)
I guess I'll have to look at the records that come in.  Ordinarily I would NOT refill blood pressure meds until 12/2020, would have him come for a med check/re-establish care before then, at the 6 month mark from his last visit (probably over the summer), or it would be his PRIOR physician to cover his prescriptions until his appt (whether that be over the summer or if they would do it until Feb).

## 2020-02-12 NOTE — Telephone Encounter (Signed)
I called pt. And got him scheduled for his next [place] and he did mention that he is back on Losartan 25mg  1 daily and he will need to call here for a refill when due.

## 2020-02-18 NOTE — Telephone Encounter (Signed)
If you told him I would refill his meds until his February visit, that is an error.   He would need a med check with me prior to that (usually at 6 month mark, which would be the summer). Not sure how much medication he got from his prior doctor, and when it will run out. If his prior doctor refills his medication until February then I guess I don't need to see him sooner, but I will not refill his med to last until February.

## 2020-02-18 NOTE — Telephone Encounter (Signed)
I wasn't sure if you wanted me to call him back and get him scheduled in for a med check or not?

## 2020-02-19 NOTE — Telephone Encounter (Signed)
I called the pt. Back to get him scheduled for a med check he informed me his has enough refills to last to the end of June so he wanted to schedule an appointment at the end of June. I scheduled him on 05/25/20 for a med check.

## 2020-05-10 NOTE — Progress Notes (Signed)
Chief Complaint  Patient presents with  . Establish Care    re-establish care, needs refills on losartan 25mg .    Patient presents for med check, to re-establish care.  He was last seen in 03/2017, had moved to [place] for his wife's job, and is now back in [place].  They just moved back last week.  He has not yet signed release forms for records from his doctor in [place].  Since last seen here, he reports that he also tore his left rotator cuff (supraspinatous), also while hiking AT.  Repaired 01/2018 He completed all but 105 miles of [place] (a part in [place]). Plans to finish [place] trails (has done half, prior to COVID) He remains very active, getting 11K steps/day, running 5-6x/week, 4.5-7 miles at a time (100/month). He is somewhat frustrated that despite running 100 miles/month, he hasn't been able to lose weight. When doing Noom, he lost down to 206#, but wasn't able to maintain it. He is considering restarting noom.  Hypertension:  He had been able to get off medication in 08/2012, but was restarted on losartan 25mg  by PCP in [place] in 11/2019.  He had noted higher BP's when donated blood (200/105).  Blood pressures are running 130/70-80 at home. Denies headaches, dizziness, chest pain, side effects. He is requesting refill.  GERD--For the mostpart, belching and heartburn resolved with dietary changes (such as not eating lunch at [place]). He periodically gets flare ups, and uses prilosec prn.  Still has a little cough "like something is in the way", similar to when he saw ENT in the past.  Chart reviewed--he previously saw ENT (Dr. Redmond Baseman) with concern for globus sensation. Symptoms resolved, but intermittently will notice some trouble swallowing, such as pills getting hung up around his larynx area (not in chest).  He reports today that he hasn't had this dysphagia recently.  Drank Propel this morning, felt like the fluid got stuck in a reservoir, took a while to pass. prilosec doesn't seem to help.   Last took it a month ago, for a 2 week course. He is unsure if this is triggered by allergies.  Not currently taking allergy meds, took in the Spring (claritin and [place] daily). He states that his nose feels clear.  BPH: He previously took Flomax, but had been able to stop it years ago without any recurrent problems.  His stream is strong, no hesitation, and no nocturia.  Doesn't sleep well, wakes up 3:30-4 for no reason, trouble getting back to sleep. Bedtime is also getting earlier (maybe because has no TV hooked up yet). Occasionally takes a nap.  ED:  Previously took Cialis with good results.  Not much of an issue at home currently.  Dyslipidemia--He previously took an OTC no-flush niacin once daily, and fish oil 1000mg  once daily.He stopped taking these a few years ago when logistically it became difficult while hiking the [place].  He reports always having low HDL, even when very active, running marathons. 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 He reports his lipids have been better ever since doing the long hiking. He tries to follow a lowfat, low cholesterol diet and gets regular exercise. Lab Results  Component Value Date   CHOL 181 06/27/2016   HDL 47 06/27/2016   LDLCALC 114 06/27/2016   TRIG 99 06/27/2016   CHOLHDL 3.9 06/27/2016   He last had lipids checked in 12/2019 in [place] (records to be requested)   PMH, PSH, SH and FH were reviewed and updated  in [place] was diagnosed with glioblastoma, given 6-9 months   Immunization History  Administered Date(s) Administered  . Influenza Split 09/08/2012  . Influenza, High Dose Seasonal PF 12/01/2015  . Influenza,inj,Quad PF,6+ Mos 09/24/2013, 08/30/2014  . PFIZER SARS-COV-2 Vaccination 02/04/2020, 02/25/2020  . Pneumococcal Conjugate-13 06/27/2016  . Tdap 01/02/2012  . Zoster 12/03/2012   Outpatient Encounter Medications as of 05/11/2020  Medication Sig Note  . losartan (COZAAR) 25 MG tablet  Take 1 tablet (25 mg total) by mouth daily.   . [DISCONTINUED] losartan (COZAAR) 25 MG tablet Take 25 mg by mouth daily.   . fluticasone (FLONASE) 50 MCG/ACT nasal spray Place 1 spray into both nostrils daily. (Patient not taking: Reported on 05/11/2020)   . Naproxen Sod-Diphenhydramine (ALEVE PM PO) Take 2 tablets by mouth at bedtime. (Patient not taking: Reported on 05/11/2020) 06/27/2016: Most nights-5 out of 7  . tadalafil (CIALIS) 20 MG tablet Take 0.5-1 tablets (10-20 mg total) by mouth every other day as needed for erectile dysfunction. (Patient not taking: Reported on 05/11/2020)   . [DISCONTINUED] amoxicillin (AMOXIL) 500 MG tablet Take 2 tablets (1,000 mg total) by mouth 2 (two) times daily.   . [DISCONTINUED] brompheniramine-pseudoephedrine-DM 30-2-10 MG/5ML syrup Take 5 mLs by mouth 4 (four) times daily as needed.   . [DISCONTINUED] cetirizine (ZYRTEC) 10 MG tablet Take 10 mg by mouth daily.   . [DISCONTINUED] Multiple Vitamins-Minerals (PRESERVISION AREDS 2 PO) Take 2 capsules by mouth daily.    No facility-administered encounter medications on file as of 05/11/2020.   No Known Allergies  ROS: No fever, chills, URI symptoms.  Some difficulty swallowing and throat issues as per HPI, which didn't improve with 2 week course of PPI.  Denies heartburn. Denies nausea, vomiting, bowel changes, urinary complaints. Moods are good.  Some insomnia per HPI.  No significant joint pains.  No bleeding, bruising, rash.  See HPI.    PHYSICAL EXAM:  BP 130/78   Pulse 60   Temp 98 F (36.7 C) (Temporal)   Ht 6' 1.75" (1.873 m)   Wt 227 lb 6.4 oz (103.1 kg)   BMI 29.39 kg/m   Wt Readings from Last 3 Encounters:  05/11/20 227 lb 6.4 oz (103.1 kg)  04/04/17 237 lb 6.4 oz (107.7 kg)  06/27/16 225 lb 12.8 oz (102.4 kg)   Well-appearing, pleasant male, in good spirits, in no distress HEENT: conjunctiva and sclera are clear, EOMI.   Nasal mucosa is moderately edematous bilaterally. No purulence.   Sounds congested/nasal voice.  Sinuses are nontender. OP is clear Neck: no lymphadenopathy, thyromegaly or mass, no bruit Heart: regular rate and rhythm Lungs: clear bilaterally Back: no spinal or CVA tenderness Abdomen: central abdominal obesity.  Nontender, no mass Extremities: no edema, normal pulses Skin: normal turgor, no rash Psych: normal mood, affect, hygiene and grooming Neuro: alert and oriented, cranial nerves intact, normal gait   ASSESSMENT/PLAN:  Essential hypertension, benign - well controlled. Cont losartan, regular execise.  Wt loss encouraged - Plan: losartan (COZAAR) 25 MG tablet  Dyslipidemia - continue lowfat, low cholesterol diet. Will recheck lipids in 12/2020  BMI 29.0-29.9,adult - counseled re: healthy diet, portions, weight loss, and risks of abdominal obesity  Abdominal obesity  Allergic rhinitis, unspecified seasonality, unspecified trigger - evidence on exam, poss contributing to throat sx. Restart Flonase daily   F/u as scheduled for AWV in 12/2020 Shingrix recommended

## 2020-05-11 ENCOUNTER — Other Ambulatory Visit: Payer: Self-pay

## 2020-05-11 ENCOUNTER — Encounter: Payer: Self-pay | Admitting: Family Medicine

## 2020-05-11 ENCOUNTER — Ambulatory Visit (INDEPENDENT_AMBULATORY_CARE_PROVIDER_SITE_OTHER): Payer: Medicare Other | Admitting: Family Medicine

## 2020-05-11 VITALS — BP 130/78 | HR 60 | Temp 98.0°F | Ht 73.75 in | Wt 227.4 lb

## 2020-05-11 DIAGNOSIS — Z6829 Body mass index (BMI) 29.0-29.9, adult: Secondary | ICD-10-CM

## 2020-05-11 DIAGNOSIS — E65 Localized adiposity: Secondary | ICD-10-CM | POA: Diagnosis not present

## 2020-05-11 DIAGNOSIS — E785 Hyperlipidemia, unspecified: Secondary | ICD-10-CM | POA: Diagnosis not present

## 2020-05-11 DIAGNOSIS — I10 Essential (primary) hypertension: Secondary | ICD-10-CM

## 2020-05-11 DIAGNOSIS — J309 Allergic rhinitis, unspecified: Secondary | ICD-10-CM

## 2020-05-11 MED ORDER — LOSARTAN POTASSIUM 25 MG PO TABS: 25.0000 mg | ORAL_TABLET | Freq: Every day | ORAL | 1 refills | Status: DC

## 2020-05-11 NOTE — Patient Instructions (Addendum)
Your blood pressure is well controlled. Continue your regular exercise. Cut back on carbs, and be sure to eat plenty of fruits, vegetables and protein, to help lose weight.  Restart Flonase--your nose was congested, and this is likely contributing to your cough (postnasal drainage)    MyPlate from USDA  MyPlate is an outline of a general healthy diet based on the 2010 Dietary Guidelines for Americans, from the U.S. Department of Agriculture Scientist, research (physical sciences)). It sets guidelines for how To follow MyPlate recommendations:  Eat a wide variety of fruits and vegetables, grains, and protein foods.  Serve smaller portions and eat less food throughout the day.  Limit portion sizes to avoid overeating.  Enjoy your food.  Get at least 150 minutes of exercise every week. This is about 30 minutes each day, 5 or more days per week. It can be difficult to have every meal look like MyPlate. Think about MyPlate as eating guidelines for an entire day, rather than each individual meal. Fruits and vegetables  Make half of your plate fruits and vegetables.  Eat many different colors of fruits and vegetables each day.  For a 2,000 calorie daily food plan, eat: ? 2 cups of vegetables every day. ? 2 cups of fruit every day.  1 cup is equal to: ? 1 cup raw or cooked vegetables. ? 1 cup raw fruit. ? 1 medium-sized orange, apple, or banana. ? 1 cup 100% fruit or vegetable juice. ? 2 cups raw leafy greens, such as lettuce, spinach, or kale. ?  cup dried fruit. Grains  One fourth of your plate should be grains.  Make at least half of the grains you eat each day whole grains.  For a 2,000 calorie daily food plan, eat 6 oz of grains every day.  1 oz is equal to: ? 1 slice bread. ? 1 cup cereal. ?  cup cooked rice, cereal, or pasta. Protein  One fourth of your plate should be protein.  Eat a wide variety of protein foods, including meat, poultry, fish, eggs, beans, nuts, and tofu.  For a 2,000  calorie daily food plan, eat 5 oz of protein every day.  1 oz is equal to: ? 1 oz meat, poultry, or fish. ?  cup cooked beans. ? 1 egg. ?  oz nuts or seeds. ? 1 Tbsp peanut butter. Dairy  Drink fat-free or low-fat (1%) milk.  Eat or drink dairy as a side to meals.  For a 2,000 calorie daily food plan, eat or drink 3 cups of dairy every day.  1 cup is equal to: ? 1 cup milk, yogurt, cottage cheese, or soy milk (soy beverage). ? 2 oz processed cheese. ? 1 oz natural cheese. Fats, oils, salt, and sugars  Only small amounts of oils are recommended.  Avoid foods that are high in calories and low in nutritional value (empty calories), like foods high in fat or added sugars.  Choose foods that are low in salt (sodium). Choose foods that have less than 140 milligrams (mg) of sodium per serving.  Drink water instead of sugary drinks. Drink enough water each day to keep your urine pale yellow. Where to find support  Work with your health care provider or a nutrition specialist (dietitian) to develop a customized eating plan that is right for you.  Download an app (mobile application) to help you track your daily food intake. Where to find more information  Go to [email or web address] for more information. Summary  MyPlate is a general guideline  for healthy eating from the USDA. It is based on the 2010 Dietary Guidelines for Americans.  In general, fruits and vegetables should take up  of your plate, grains should take up  of your plate, and protein should take up  of your plate. This information is not intended to replace advice given to you by your health care provider. Make sure you discuss any questions you have with your health care provider. Document Revised: 04/16/2019 Document Reviewed: 02/11/2017 Elsevier Patient Education  [employer].

## 2020-05-25 ENCOUNTER — Encounter: Payer: Medicare Other | Admitting: Family Medicine

## 2020-06-01 ENCOUNTER — Encounter: Payer: Self-pay | Admitting: *Deleted

## 2020-06-07 ENCOUNTER — Encounter: Payer: Self-pay | Admitting: Family Medicine

## 2020-06-08 ENCOUNTER — Encounter: Payer: Self-pay | Admitting: Family Medicine

## 2020-09-26 ENCOUNTER — Telehealth: Payer: Self-pay

## 2020-09-26 NOTE — Telephone Encounter (Signed)
Pt. Called stating that he takes Losartan and on the bottle it says it may cause dizziness, he stats when he gets up in the morning he has dizziness so he sits in the bed for a few minutes before getting up. He wants to know if this medication should be lowered or changed to a different BP med.

## 2020-09-26 NOTE — Telephone Encounter (Signed)
Advise pt that ALL blood pressure lowering medications will state that it "may cause dizziness", as if it lowers the BP too much, you can feel dizzy.  The information I need to know to determine if the dose needs to be changed is what his blood pressure has been running. Dehydration can also cause dizziness, to be sure to stay well hydrated, and to get up from laying down slowly.

## 2020-09-26 NOTE — Telephone Encounter (Signed)
Spoke with patient and he said his numbers range in the 120's/upper 70's to mid 80's daily. He drinks about 40oz of water a day. He does admit that this started while he was doing a 230 [place] the [place] and may have been a little dehydrated then, but he is trying to stay hydrated now. Any other thoughts?

## 2020-09-26 NOTE — Telephone Encounter (Signed)
Those are good blood pressures. I don't think we should lower the dose.  He can, however, try taking the medication at a different time to see if he tolerates that better (if taking it in the morning, switch to evening, or vice versa)

## 2020-09-28 NOTE — Telephone Encounter (Signed)
Patient advised.

## 2020-10-14 ENCOUNTER — Ambulatory Visit (INDEPENDENT_AMBULATORY_CARE_PROVIDER_SITE_OTHER): Payer: Medicare Other | Admitting: Medical

## 2020-10-14 ENCOUNTER — Encounter: Payer: Self-pay | Admitting: Medical

## 2020-10-14 VITALS — BP 118/72 | HR 61 | Temp 98.5°F | Wt 214.6 lb

## 2020-10-14 DIAGNOSIS — J029 Acute pharyngitis, unspecified: Secondary | ICD-10-CM | POA: Diagnosis not present

## 2020-10-14 DIAGNOSIS — R42 Dizziness and giddiness: Secondary | ICD-10-CM | POA: Diagnosis not present

## 2020-10-14 DIAGNOSIS — I10 Essential (primary) hypertension: Secondary | ICD-10-CM | POA: Diagnosis not present

## 2020-10-14 DIAGNOSIS — J020 Streptococcal pharyngitis: Secondary | ICD-10-CM

## 2020-10-14 DIAGNOSIS — R52 Pain, unspecified: Secondary | ICD-10-CM

## 2020-10-14 LAB — POC COVID19 BINAXNOW: SARS Coronavirus 2 Ag: NEGATIVE

## 2020-10-14 LAB — POCT RAPID STREP A (OFFICE): Rapid Strep A Screen: POSITIVE — AB

## 2020-10-14 MED ORDER — AMOXICILLIN 500 MG PO TABS: 500.0000 mg | ORAL_TABLET | Freq: Three times a day (TID) | ORAL | 0 refills | Status: DC

## 2020-10-14 NOTE — Addendum Note (Signed)
Addended byLinus Salmons, Cleota Pellerito D on: 10/14/2020 12:22 PM   Modules accepted: Orders

## 2020-10-14 NOTE — Progress Notes (Signed)
Subjective:  Dillon Caldwell is a 69 y.o. male who presents for Chief Complaint  Patient presents with  . other    ulcer in throat rt. side of throat, wipped out and had chills yesterday, painful to swallow started 3 days ago     Few days ago felt ulcer possibly on right side of throat. Then yesterday began getting chills, painful to swallow, getting up phlegm from throat.   In middle of night last night chills resolved, but having pain in throat, difficulty swallowing.   Doesn't feel great.  Thinks he has some infection.  Only has cough to get up phlegm.  No sinus pain or ear pressure.  Has had some headaches.  No nausea, no vomiting, no diarrhea.  No SOB or wheezing.   No sick contacts.    Wears mask regularly for prevention.  Got covid booster 3 weeks ago.  He notes some weight loss intentional recently.   Did 24mi on Dillon Caldwell in August.   Does a long trek a month per year.   Occasionally has felt a little lightheaded getting up from a seated position.  He does check his blood pressures at home and never sees any numbers less than 110/60.  No syncope.  No chest pain.  No swelling.  He is compliant with losartan  No other aggravating or relieving factors.    No other c/o.  Past Medical History:  Diagnosis Date  . BPH (benign prostatic hyperplasia)   . Erectile dysfunction    previously took Cialis  . Essential hypertension, benign    diagnosed age 1; off meds 08/2012  . Hypogonadism male    testosterone level 186 on 09/2010  . Impaired fasting glucose    A1c 6.1 and fglu 127 01/2011  . Macular degeneration, dry 2011   followed by retinal specialist (dry)  . Tachycardia    likely h/o SVT, treated with ablation    Current Outpatient Medications on File Prior to Visit  Medication Sig Dispense Refill  . losartan (COZAAR) 25 MG tablet Take 1 tablet (25 mg total) by mouth daily. 90 tablet 1  . fluticasone (FLONASE) 50 MCG/ACT nasal spray Place 1 spray into both nostrils  daily. (Patient not taking: Reported on 05/11/2020)    . Naproxen Sod-Diphenhydramine (ALEVE PM PO) Take 2 tablets by mouth at bedtime. (Patient not taking: Reported on 05/11/2020)    . tadalafil (CIALIS) 20 MG tablet Take 0.5-1 tablets (10-20 mg total) by mouth every other day as needed for erectile dysfunction. (Patient not taking: Reported on 05/11/2020) 15 tablet 11   No current facility-administered medications on file prior to visit.    The following portions of the patient's history were reviewed and updated as appropriate: allergies, current medications, past family history, past medical history, past social history, past surgical history and problem list.  ROS Otherwise as in subjective above  Objective: BP 118/72   Pulse 61   Temp 98.5 F (36.9 C)   Wt 214 lb 9.6 oz (97.3 kg)   BMI 27.74 kg/m   Wt Readings from Last 3 Encounters:  10/14/20 214 lb 9.6 oz (97.3 kg)  05/11/20 227 lb 6.4 oz (103.1 kg)  04/04/17 237 lb 6.4 oz (107.7 kg)    General appearance: alert, no distress, well developed, well nourished HEENT: normocephalic, sclerae anicteric, conjunctiva pink and moist, TMs pearly, nares patent, no discharge or erythema, pharynx with mild erythema and some mucus postnasal drainage Oral cavity: MMM, no lesions Neck: supple, no  lymphadenopathy, no thyromegaly, no masses Heart: RRR, normal S1, S2, no murmurs Lungs: CTA bilaterally, no wheezes, rhonchi, or rales Pulses: 2+ radial pulses, 2+ pedal pulses, normal cap refill Ext: no edema   Assessment: Encounter Diagnoses  Name Primary?  . Sore throat Yes  . Body aches   . Dizziness   . Essential hypertension, benign   . Strep pharyngitis      Plan: We discussed his acute symptoms of sore throat, body aches, dizziness, phlegm.  Swabs today for Covid and strep.  Covid negative.   +strep.  Begin amoxicillin.  Discussed good hydration, salt water gargles, warm fluids, Tylenol, supportive care.  Dizziness-possibly  related to some orthostatic hypotension.  He will monitor his blood pressures at home and watch for any low readings.  He lost 18 pounds a few months ago doing a 200 mile track on the [place] trail.  He may be getting low readings and not realize it.  He is compliant with his losartan blood pressure pill.  If he is seeing low readings he is going to contact us back in case we need to cut back on his losartan   Dillon Caldwell was seen today for other.  Diagnoses and all orders for this visit:  Sore throat  Body aches  Dizziness  Essential hypertension, benign  Strep pharyngitis  Other orders -     amoxicillin (AMOXIL) 500 MG tablet; Take 1 tablet (500 mg total) by mouth in the morning, at noon, and at bedtime.    Follow up: prn

## 2020-10-15 ENCOUNTER — Encounter: Payer: Self-pay | Admitting: Family Medicine

## 2020-11-02 ENCOUNTER — Encounter: Payer: Self-pay | Admitting: Orthopaedic Surgery

## 2020-11-02 ENCOUNTER — Other Ambulatory Visit: Payer: Self-pay

## 2020-11-02 ENCOUNTER — Ambulatory Visit (INDEPENDENT_AMBULATORY_CARE_PROVIDER_SITE_OTHER): Payer: Medicare Other

## 2020-11-02 ENCOUNTER — Ambulatory Visit (INDEPENDENT_AMBULATORY_CARE_PROVIDER_SITE_OTHER): Payer: Medicare Other | Admitting: Orthopaedic Surgery

## 2020-11-02 DIAGNOSIS — R0781 Pleurodynia: Secondary | ICD-10-CM

## 2020-11-02 NOTE — Progress Notes (Signed)
Office Visit Note   Patient: Dillon Caldwell           Date of Birth: 02/26/1951           MRN: [id number] Visit Date: 11/02/2020              Requested by: Rita Ohara, [place],  Dillon Caldwell PCP: Rita Ohara, MD   Assessment & Plan: Visit Diagnoses:  1. Rib pain on right side     Plan: Dillon Caldwell injured his right anterior chest wall and a fall about 8 days ago.  X-rays were negative.  He could have a nondisplaced fracture but I think is fine to perform activities as tolerated.  I would suspect this would resolve over the next month.  Follow-Up Instructions: Return if symptoms worsen or fail to improve.   Orders:  Orders Placed This Encounter  Procedures  . XR Ribs Unilateral Right   No orders of the defined types were placed in this encounter.     Procedures: No procedures performed   Clinical Data: No additional findings.   Subjective: Chief Complaint  Patient presents with  . Chest - Pain  Patient presents today for right sided chest pain. He states that he was running last week and tripped on a raised area of sidewalk. He came down and fell with his right hand in his upper chest. He has an area of tenderness the size of a quarter at his upper right side of his chest. He said that it is tender to touch. Also certain movements cause pain. He has ran a couple times since the injury and does well, except some pain with doing so. No shortness of breath. He has taken tylenol as needed.   HPI  Review of Systems   Objective: Vital Signs: There were no vitals taken for this visit.  Physical Exam Constitutional:      Appearance: He is well-developed.  Eyes:     Pupils: Pupils are equal, round, and reactive to light.  Pulmonary:     Effort: Pulmonary effort is normal.  Skin:    General: Skin is warm and dry.  Neurological:     Mental Status: He is alert and oriented to person, place, and time.  Psychiatric:        Behavior:  Behavior normal.     Ortho Exam awake alert and oriented x3 comfortable sitting.  Without shortness of breath.  There was some local tenderness over the anterior chest wall.  No crepitation.  No pain with range of motion of the shoulder.  No sternal pain  Specialty Comments:  No specialty comments available.  Imaging: XR Ribs Unilateral Right  Result Date: 11/02/2020 Right rib detail films were obtained with a marker placed in the area of point tenderness.  No obvious fracture.  Lungs expanded    PMFS History: Patient Active Problem List   Diagnosis Date Noted  . Rib pain on right side 11/02/2020  . Strep pharyngitis 10/14/2020  . GERD (gastroesophageal reflux disease) 12/02/2013  . Obesity (BMI 30.0-34.9) 12/02/2013  . Essential hypertension, benign 03/09/2013  . Dyslipidemia 01/02/2012  . BPH (benign prostatic hyperplasia) 01/02/2012   Past Medical History:  Diagnosis Date  . BPH (benign prostatic hyperplasia)   . Erectile dysfunction    previously took Cialis  . Essential hypertension, benign    diagnosed age 24; off meds 08/2012  . Hypogonadism male    testosterone level 186 on 09/2010  . Impaired  fasting glucose    A1c 6.1 and fglu 127 01/2011  . Macular degeneration, dry 2011   followed by retinal specialist (dry)  . Tachycardia    likely h/o SVT, treated with ablation     Family History  Problem Relation Age of Onset  . Diabetes Mother   . Heart disease Mother        angioplasty in her 83's  . Pulmonary fibrosis Mother        possibly misdiagnosed  . Hypertension Mother   . Macular degeneration Mother   . Thyroid disease Mother        had thyroidectomy (unclear diagnosis for why)  . Stroke Brother 48  . Brain cancer Brother 56       glioblastoma  . Crohn's disease Son        dx'd at age 3 months  . Cancer Maternal Grandmother 92       colon cancer  . Macular degeneration Maternal Aunt   . Diabetes Maternal Aunt   . Macular degeneration Maternal  Uncle   . Diabetes Maternal Uncle   . Cancer Maternal Uncle        prostate cancer  . Macular degeneration Maternal Aunt   . Diabetes Maternal Aunt     Past Surgical History:  Procedure Laterality Date  . ABLATION OF DYSRHYTHMIC FOCUS  2006   ?PSVT by history  . ANKLE SURGERY     both ankles with bone spurs (related to [place])  . ARTHROSCOPIC REPAIR ACL Left 2001   L knee  . FOOT SURGERY Bilateral    bone spurs removed from both ankles in the 70's (different years, similar surgery bilaterally)  . ROTATOR CUFF REPAIR Right 04/08/15  . ROTATOR CUFF REPAIR Left 01/2018   supraspinatous (repaired in [place])  . TONSILLECTOMY  child  . VASECTOMY     Social History   Occupational History  . Occupation: retired Pharmacist, hospital  Tobacco Use  . Smoking status: Never Smoker  . Smokeless tobacco: Never Used  Vaping Use  . Vaping Use: Never used  Substance and Sexual Activity  . Alcohol use: Yes    Comment: maybe 3-4 per week.  . Drug use: No  . Sexual activity: Yes    Partners: Female

## 2020-11-14 ENCOUNTER — Other Ambulatory Visit: Payer: Medicare Other

## 2020-11-14 DIAGNOSIS — Z20822 Contact with and (suspected) exposure to covid-19: Secondary | ICD-10-CM

## 2020-11-15 ENCOUNTER — Encounter: Payer: Self-pay | Admitting: Medical

## 2020-11-15 ENCOUNTER — Other Ambulatory Visit: Payer: Self-pay

## 2020-11-15 ENCOUNTER — Telehealth (INDEPENDENT_AMBULATORY_CARE_PROVIDER_SITE_OTHER): Payer: Medicare Other | Admitting: Medical

## 2020-11-15 VITALS — Temp 96.7°F | Ht 74.0 in | Wt 215.0 lb

## 2020-11-15 DIAGNOSIS — R059 Cough, unspecified: Secondary | ICD-10-CM

## 2020-11-15 DIAGNOSIS — J029 Acute pharyngitis, unspecified: Secondary | ICD-10-CM | POA: Diagnosis not present

## 2020-11-15 DIAGNOSIS — Z8709 Personal history of other diseases of the respiratory system: Secondary | ICD-10-CM

## 2020-11-15 NOTE — Progress Notes (Signed)
  Subjective:     Patient ID: Dillon Caldwell, male   DOB: 08-24-1951, 69 y.o.   MRN: [id number]  This visit type was conducted due to national recommendations for restrictions regarding the COVID-19 Pandemic (e.g. social distancing) in an effort to limit this patient's exposure and mitigate transmission in our community.  Due to their co-morbid illnesses, this patient is at least at moderate risk for complications without adequate follow up.  This format is felt to be most appropriate for this patient at this time.    Documentation for virtual audio and video telecommunications through [employer] encounter:  The patient was located at home. The provider was located in the office. The patient did consent to this visit and is aware of possible charges through their insurance for this visit.  The other persons participating in this telemedicine service were none. Time spent on call was 20 minutes and in review of previous records 20 minutes total.  This virtual service is not related to other E/M service within previous 7 days.   HPI Chief Complaint  Patient presents with  . Sore Throat    Feels like strep he had in November   Virtual consult for sore throat.  I just saw him back in November for strep positive.  Current symptoms include 2-day history of sore throat, postnasal drip, mucus in the throat, and the mucus is triggering the cough in the back of her throat.  Otherwise does not feel too terrible.  No fever no nausea vomiting no body aches or chills, no shortness of breath or wheezing, no loss of taste or smell.  Sense of smell not so good great anyway.  No sick contacts.  He had a Covid PCR swab done yesterday at [facility], but will get results for couple days.  No rapid Covid test was done.  He went to [place] recently, was around some folks who attended a wedding party were 16 people were positive for Covid but he himself did not have direct exposure for a known  positive Covid contact  No other aggravating or relieving factors. No other complaint.   Review of Systems As in subjective    Objective:   Physical Exam Due to coronavirus pandemic stay at home measures, patient visit was virtual and they were not examined in person.   Temp (!) 96.7 F (35.9 C)   Ht 6\' 2"  (1.88 m)   Wt 215 lb (97.5 kg)   BMI 27.60 kg/m       Assessment:     Encounter Diagnoses  Name Primary?  . Sore throat Yes  . History of strep pharyngitis   . Cough        Plan:     Symptoms currently suggest viral pharyngitis.  We discussed supportive care, rest, hydration, salt or gargles.  He declined strep swab today.  However if his Covid test comes back negative in the next 2 days he may come in for a strep swab in the back parking lot.  For now use supportive care measures.  Quarantine.  Dillon Caldwell was seen today for sore throat.  Diagnoses and all orders for this visit:  Sore throat -     Rapid Strep A; Future  History of strep pharyngitis -     Rapid Strep A; Future  Cough  f/u pending covid test

## 2020-11-16 ENCOUNTER — Other Ambulatory Visit (INDEPENDENT_AMBULATORY_CARE_PROVIDER_SITE_OTHER): Payer: Medicare Other

## 2020-11-16 DIAGNOSIS — Z8709 Personal history of other diseases of the respiratory system: Secondary | ICD-10-CM | POA: Diagnosis not present

## 2020-11-16 DIAGNOSIS — J029 Acute pharyngitis, unspecified: Secondary | ICD-10-CM

## 2020-11-16 LAB — NOVEL CORONAVIRUS, NAA: SARS-CoV-2, NAA: NOT DETECTED

## 2020-11-16 LAB — SARS-COV-2, NAA 2 DAY TAT

## 2020-11-16 LAB — POCT RAPID STREP A (OFFICE): Rapid Strep A Screen: NEGATIVE

## 2021-01-11 NOTE — Progress Notes (Signed)
Chief Complaint  Patient presents with  . Medicare Wellness    Fasting AWV. Is having some issues remembering words from time to time. Doing things like leaving the water on, leaving the stove on. Some forgetful things. Did not have any vaccines while in [place] other than covid. Did have colonoscopy.     Dillon Caldwell is a 70 y.o. male who presents for annual wellness visit and follow-up on chronic medical conditions.   He has some concerns about his memory. He left the stove on once.  Left water running in the sink, couldn't hear it running. This has occurred 3-4 times, always in the kitchen.  Forgets stuff off grocery list. For 2 months couldn't remember the word "appraisal" (can now recall it easily).  Hypertension:  He had been able to get off medication in 08/2012, but was restarted on losartan 25mg  by PCP in [place] in 11/2019. Blood pressures are running 120-130/70-80 at home, sometimes higher (max seen was 170/80; he checks after h walks upstairs, notes it elevated, comes down on repeat checks). Pulse is 60 or lower. Denies headaches, dizziness, chest pain, side effects. No edema.  GERD--No longer having issues with belching and heartburn, even with eating Chipotle once a week. No longer needs Prilosec. In the past has seen ENT (Dr. Redmond Baseman) for globus sensation; he sometimes will have a pill get hung up in throat--this is very infrequent, and relieved by drinking water.  Insomnia:  He needs to take Aleve PM or Tylenol PM in order to get to sleep.  Melatonin wasn't effective. Goes to sleep at 10, waking up 4-4:30 sometimes.  Makes himself stay in bed until 5, realizes that he does get back to sleep after that. No longer waking up at 3 in the morning like he used to.  He took a trip to [place] with his brother, and got gummies with indica and melatonin) and would sleep until well until 6am when taking this.  He feels like there is a component of anxiety. He has no problems sleeping when hiking (11  hours/day on the trail)  ED: Previously took Cialis with good results. Not much of an issue at home currently.  Dyslipidemia--He previously tookan OTC no-flush niacin once daily, and fish oil 1000mg  once daily.He stopped taking these a few years ago when logistically it became difficult [place].He reports always having low HDL, even when very active, running marathons. 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 He reports his lipids have been better ever since doing the long hiking. He tries to follow a lowfat, low cholesterol diet and gets regular exercise. Lab Results  Component Value Date   CHOL 181 06/27/2016   HDL 47 06/27/2016   LDLCALC 114 06/27/2016   TRIG 99 06/27/2016   CHOLHDL 3.9 06/27/2016  (taking OTC Niacin when this was checked).  He last had lipids checked in [place] in 2021, LDL 118, HDL 41.  Prediabetes--A1c was 6.1% in 11/2019. Fglu was 111. He remains physically active, though has gained weight per below.  Immunization History  Administered Date(s) Administered  . Influenza Split 09/08/2012  . Influenza, High Dose Seasonal PF 12/01/2015, 07/27/2020  . Influenza,inj,Quad PF,6+ Mos 09/24/2013, 08/30/2014  . PFIZER(Purple Top)SARS-COV-2 Vaccination 02/04/2020, 02/25/2020, 09/25/2020  . Pneumococcal Conjugate-13 06/27/2016  . Tdap 01/02/2012  . Zoster 12/03/2012   Last colonoscopy: He had one in [place], around his 69th birthday, recalls it was normal. +hemorrhoids.  Prior was in 2009. Last PSA: Lab Results  Component Value Date   PSA 1.69 12/22/2019   PSA 1.45 06/27/2016   PSA 1.88 12/30/2014   Dentist: Dr. Orlando Penner two times/year Ophtho: yearly Exercise:  Runs every other day (4.2 miles, once/week runs a little longer), walks (3 miles in 1 hour) or bikes ([place], 20-45 mins)  on the other days.  Pilates once a week (some resistance), some [place] workouts also use light weights.   Other doctors caring for patient  include: Dentist: Dr. Orlando Penner Ophtho: [facility] [address], and Dr. Zigmund Daniel (retinal specialist) ENT: Dr. Redmond Baseman Ortho: Dr. Noemi Chapel  Depression screen:  Negative Fall screen:  3 in the last year. Tripped over a root in the street while running, fell running there twice, once cracked a rib (he has since switched to running on the other side of the street).  Slipped [place], tweaked his left knee (no injury, able to keep hiking). Functional status screen: notable for decreased hearing; has hearing aids bilaterally. Some trouble with vision (noted when reading Kindle), macular degen. Mini-Cog: normal  End of Life Discussion:  Patient has a living will and medical power of attorney  PMH, PSH, SH and FH were reviewed and updated  Brother with glioblastoma, had been given 6-9 months, and is now at a year with reportedly clear MRI.  He is s/p surgery, and is running and playing softball.  Outpatient Encounter Medications as of 01/12/2021  Medication Sig Note  . losartan (COZAAR) 25 MG tablet Take 1 tablet (25 mg total) by mouth daily.   . Naproxen Sod-Diphenhydramine (ALEVE PM PO) Take 2 tablets by mouth at bedtime. 01/12/2021: Takes either Tylenol PM or Aleve PM  . fluticasone (FLONASE) 50 MCG/ACT nasal spray Place 1 spray into both nostrils daily.  (Patient not taking: No sig reported)   . tadalafil (CIALIS) 20 MG tablet Take 0.5-1 tablets (10-20 mg total) by mouth every other day as needed for erectile dysfunction. (Patient not taking: No sig reported)   . [DISCONTINUED] amoxicillin (AMOXIL) 500 MG tablet Take 1 tablet (500 mg total) by mouth in the morning, at noon, and at bedtime. (Patient not taking: Reported on 11/15/2020)    No facility-administered encounter medications on file as of 01/12/2021.   No Known Allergies  ROS: The patient denies anorexia, fever, headaches, ear pain, hoarseness, chest pain, dizziness, syncope, dyspnea on exertion, cough, swelling, nausea,  vomiting, diarrhea, constipation, abdominal pain, melena, hematochezia, hematuria, nocturia, hesitance or weakened stream, incontinence, dysuria, genital lesions, joint pains, numbness, tingling, weakness, tremor, suspicious skin lesions, depression, anxiety, abnormal bleeding/bruising, or enlarged lymph nodes. +insomnia per HPI +hearing loss--wears hearing aids. Tinnitus bilaterally (chronic)   PHYSICAL EXAM:  BP 120/70   Pulse (!) 56   Ht 6\' 1"  (1.854 m)   Wt 221 lb 3.2 oz (100.3 kg)   BMI 29.18 kg/m   Wt Readings from Last 3 Encounters:  01/12/21 221 lb 3.2 oz (100.3 kg)  11/15/20 215 lb (97.5 kg)  10/14/20 214 lb 9.6 oz (97.3 kg)    General Appearance:  Alert, cooperative, no distress, appears stated age   Head:  Normocephalic, without obvious abnormality, atraumatic   Eyes:  PERRL, conjunctiva/corneas clear, EOM's intact, fundi  benign   Ears:  Wearing hearing aids bilaterally; TM's and EACs are normal upon their removal  Nose:  Not examined, wearing mask due to COVID-19 pandemic   Throat:  Not examined, wearing mask due to COVID-19 pandemic  Neck:  Supple, no lymphadenopathy; thyroid: no enlargement/ tenderness/nodules; no carotid bruit  or JVD   Back:  Spine nontender, no curvature, ROM normal, no CVA tenderness   Lungs:  Clear to auscultation bilaterally without wheezes, rales or ronchi; respirations unlabored   Chest Wall:  No tenderness or deformity   Heart:  Bradycardic, regular rhythm, S1 and S2 normal, no murmur, rub  or gallop   Breast Exam:  No chest wall tenderness, masses or gynecomastia   Abdomen:  Soft, non-tender, nondistended, normoactive bowel sounds,  no masses, no hepatosplenomegaly. +abdominal obesity  Genitalia:  Normal male external genitalia without lesions. Testicles without masses. No inguinal hernias.   Rectal:  Normal sphincter tone, no masses or tenderness; guaiac negative stool. Prostate smooth, no nodules, not  enlarged. Hemorrhoid seen/felt externally, that moved inside with rectal exam, nontender.  Extremities:  No clubbing, cyanosis or edema. Focal swelling at/above patella bilaterally R>L, nontender (bursitis; "knee boob" --chronic per pt)  Pulses:  2+ and symmetric all extremities   Skin:  Skin color, texture, turgor normal. 2-2.5 cm subcutanous, mobile soft tissue mass at the left upper temple area.  It is flesh colored, soft, mobile, no erythema. (previously 1.25-1.5 cm). Purpura noted R forearm  Lymph nodes: Cervical, supraclavicular, and axillary nodes normal   Neurologic:  Normal strength, sensation and gait; reflexes 2+ and symmetric throughout    Psych:  Normal mood, affect, hygiene and grooming   ASSESSMENT/PLAN:  Medicare annual wellness visit, subsequent  Essential hypertension, benign - well controlled on losartan, cont - Plan: Comprehensive metabolic panel  Dyslipidemia - due for recheck. Cont lowfat, low cholesterol diet - Plan: Lipid panel  Prediabetes - encouraged low carb diet and weight loss - Plan: Comprehensive metabolic panel, Hemoglobin A1c  Screening for prostate cancer - Plan: PSA  Memory change - hearing may be a factor for the sink; cognitive tips given.  Not unsafe (stove just once). f/u if worsening - Plan: TSH  Fatigue, unspecified type - Plan: Comprehensive metabolic panel, CBC with Differential/Platelet, TSH  Need for pneumococcal vaccination - Plan: Pneumococcal polysaccharide vaccine 23-valent greater than or equal to 2yo subcutaneous/IM  Insomnia, unspecified type - counseled re: safety of OTC meds, briefly discussed Belsomra. 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 PM meds have been very effective--avoid aleve, may use tylenol if having pain, otherwise rec plain benadryl.  Discussed safety/risks.  Briefly discussed Belsomra as safest  longterm alternative. Encouraged Mediterranean diet. Discussed memory concerns at length, to f/u if worsening.  Sink seems to be more hearing related rather than memory. Continue physical and brain exercises.  Discussed PSA screening (risks/benefits), recommended at least 30 minutes of aerobic activity at least 5 days/week, weight-bearing exercise 2x/week; proper sunscreen use reviewed; healthy diet and alcohol recommendations (less than or equal to 2 drinks/day) reviewed; regular seatbelt use; changing batteries in smoke detectors. Self-testicular exams. Immunization recommendations discussed--continue yearly high dose flu shots.  Pneumovax given today. Tetanus booster next year, from pharmacy.  Shingrix (from pharmacy) encouraged.  Colonoscopy recommendations reviewed--UTD, will request records.  MOST form completed, see scanned form.  Doesn't want prolonged measures. Requested Living Will, Healthcare POA for scanning into chart.   Medicare Attestation I have personally reviewed: The patient's medical and social history Their use of alcohol, tobacco or illicit drugs Their current medications and supplements The patient's functional ability including ADLs,fall risks, home safety risks, cognitive, and hearing and visual impairment Diet and physical activities Evidence for depression or mood disorders  The patient's weight, height, and BMI have been recorded in the chart.  I have made referrals, counseling, and provided education to the patient based on review of the above and I have provided the patient with a written personalized care plan for preventive services.

## 2021-01-11 NOTE — Patient Instructions (Addendum)
HEALTH MAINTENANCE RECOMMENDATIONS:  It is recommended that you get at least 30 minutes of aerobic exercise at least 5 days/week (for weight loss, you may need as much as 60-90 minutes). This can be any activity that gets your heart rate up. This can be divided in 10-15 minute intervals if needed, but try and build up your endurance at least once a week.  Weight bearing exercise is also recommended twice weekly.  Eat a healthy diet with lots of vegetables, fruits and fiber.  "Colorful" foods have a lot of vitamins (ie green vegetables, tomatoes, red peppers, etc).  Limit sweet tea, regular sodas and alcoholic beverages, all of which has a lot of calories and sugar.  Up to 2 alcoholic drinks daily may be beneficial for men (unless trying to lose weight, watch sugars).  Drink a lot of water.  Sunscreen of at least SPF 30 should be used on all sun-exposed parts of the skin when outside between the hours of 10 am and 4 pm (not just when at beach or pool, but even with exercise, golf, tennis, and yard work!)  Use a sunscreen that says "broad spectrum" so it covers both UVA and UVB rays, and make sure to reapply every 1-2 hours.  Remember to change the batteries in your smoke detectors when changing your clock times in the spring and fall.  Carbon monoxide detectors are recommended for your home.  Use your seat belt every time you are in a car, and please drive safely and not be distracted with cell phones and texting while driving.    Mr. Wilczak , Thank you for taking time to come for your Medicare Wellness Visit. I appreciate your ongoing commitment to your health goals. Please review the following plan we discussed and let me know if I can assist you in the future.    This is a list of the screening recommended for you and due dates:  Health Maintenance  Topic Date Due  . Pneumonia vaccines (2 of 2 - PPSV23) 06/27/2017  . Colon Cancer Screening  11/26/2017  . Flu Shot  06/26/2020  . Tetanus  Vaccine  01/01/2022  . COVID-19 Vaccine  Completed  .  Hepatitis C: One time screening is recommended by Center for Disease Control  (CDC) for  adults born from [birth year] through 1966.   Completed   You were given pneumovax today. Flu shot date was entered today--continue yearly.  I recommend getting the new shingles vaccine (Shingrix). Since you have Medicare, you will need to get this from the pharmacy, as it is covered by Part D. This is a series of 2 injections, spaced 2 months apart.  You'll want to wait at least 2 weeks after today's vaccine before getting the shingles vaccine.  Colonoscopy is up to date, we just need the records.  Tetanus booster will be due next year (you will need to get from the pharmacy)  Use Tylenol PM rather than Aleve (safer for the stomach and kidney). If not having pain, you can use just the diphenhydramine (benadryl).   Insomnia Insomnia is a sleep disorder that makes it difficult to fall asleep or stay asleep. Insomnia can cause fatigue, low energy, difficulty concentrating, mood swings, and poor performance at work or school. There are three different ways to classify insomnia:  Difficulty falling asleep.  Difficulty staying asleep.  Waking up too early in the morning. Any type of insomnia can be long-term (chronic) or short-term (acute). Both are common. Short-term insomnia usually  lasts for three months or less. Chronic insomnia occurs at least three times a week for longer than three months. What are the causes? Insomnia may be caused by another condition, situation, or substance, such as:  Anxiety.  Certain medicines.  Gastroesophageal reflux disease (GERD) or other gastrointestinal conditions.  Asthma or other breathing conditions.  Restless legs syndrome, sleep apnea, or other sleep disorders.  Chronic pain.  Menopause.  Stroke.  Abuse of alcohol, tobacco, or illegal drugs.  Mental health conditions, such as  depression.  Caffeine.  Neurological disorders, such as Alzheimer's disease.  An overactive thyroid (hyperthyroidism). Sometimes, the cause of insomnia may not be known. What increases the risk? Risk factors for insomnia include:  Gender. Women are affected more often than men.  Age. Insomnia is more common as you get older.  Stress.  Lack of exercise.  Irregular work schedule or working night shifts.  Traveling between different time zones.  Certain medical and mental health conditions. What are the signs or symptoms? If you have insomnia, the main symptom is having trouble falling asleep or having trouble staying asleep. This may lead to other symptoms, such as:  Feeling fatigued or having low energy.  Feeling nervous about going to sleep.  Not feeling rested in the morning.  Having trouble concentrating.  Feeling irritable, anxious, or depressed. How is this diagnosed? This condition may be diagnosed based on:  Your symptoms and medical history. Your health care provider may ask about: ? Your sleep habits. ? Any medical conditions you have. ? Your mental health.  A physical exam. How is this treated? Treatment for insomnia depends on the cause. Treatment may focus on treating an underlying condition that is causing insomnia. Treatment may also include:  Medicines to help you sleep.  Counseling or therapy.  Lifestyle adjustments to help you sleep better. Follow these instructions at home: Eating and drinking  Limit or avoid alcohol, caffeinated beverages, and cigarettes, especially close to bedtime. These can disrupt your sleep.  Do not eat a large meal or eat spicy foods right before bedtime. This can lead to digestive discomfort that can make it hard for you to sleep.   Sleep habits  Keep a sleep diary to help you and your health care provider figure out what could be causing your insomnia. Write down: ? When you sleep. ? When you wake up during the  night. ? How well you sleep. ? How rested you feel the next day. ? Any side effects of medicines you are taking. ? What you eat and drink.  Make your bedroom a dark, comfortable place where it is easy to fall asleep. ? Put up shades or blackout curtains to block light from outside. ? Use a white noise machine to block noise. ? Keep the temperature cool.  Limit screen use before bedtime. This includes: ? Watching TV. ? Using your smartphone, tablet, or computer.  Stick to a routine that includes going to bed and waking up at the same times every day and night. This can help you fall asleep faster. Consider making a quiet activity, such as reading, part of your nighttime routine.  Try to avoid taking naps during the day so that you sleep better at night.  Get out of bed if you are still awake after 15 minutes of trying to sleep. Keep the lights down, but try reading or doing a quiet activity. When you feel sleepy, go back to bed.   General instructions  Take over-the-counter and  prescription medicines only as told by your health care provider.  Exercise regularly, as told by your health care provider. Avoid exercise starting several hours before bedtime.  Use relaxation techniques to manage stress. Ask your health care provider to suggest some techniques that may work well for you. These may include: ? Breathing exercises. ? Routines to release muscle tension. ? Visualizing peaceful scenes.  Make sure that you drive carefully. Avoid driving if you feel very sleepy.  Keep all follow-up visits as told by your health care provider. This is important. Contact a health care provider if:  You are tired throughout the day.  You have trouble in your daily routine due to sleepiness.  You continue to have sleep problems, or your sleep problems get worse. Get help right away if:  You have serious thoughts about hurting yourself or someone else. If you ever feel like you may hurt  yourself or others, or have thoughts about taking your own life, get help right away. You can go to your nearest emergency department or call:  Your local emergency services (911 in the [place]  A suicide crisis helpline, such as the [facility] at 847-246-1446. This is open 24 hours a day. Summary  Insomnia is a sleep disorder that makes it difficult to fall asleep or stay asleep.  Insomnia can be long-term (chronic) or short-term (acute).  Treatment for insomnia depends on the cause. Treatment may focus on treating an underlying condition that is causing insomnia.  Keep a sleep diary to help you and your health care provider figure out what could be causing your insomnia. This information is not intended to replace advice given to you by your health care provider. Make sure you discuss any questions you have with your health care provider. Document Revised: 09/22/2020 Document Reviewed: 09/22/2020 Elsevier Patient Education  2021 [place] refers to food and lifestyle choices that are based on the traditions of countries located on the [employer]. This way of eating has been shown to help prevent certain conditions and improve outcomes for people who have chronic diseases, like kidney disease and heart disease. What are tips for following this plan? Lifestyle  Cook and eat meals together with your family, when possible.  Drink enough fluid to keep your urine clear or pale yellow.  Be physically active every day. This includes: ? Aerobic exercise like running or swimming. ? Leisure activities like gardening, walking, or housework.  Get 7-8 hours of sleep each night.  If recommended by your health care provider, drink red wine in moderation. This means 1 glass a day for nonpregnant women and 2 glasses a day for men. A glass of wine equals 5 oz (150 mL). Reading food labels  Check the serving size of  packaged foods. For foods such as rice and pasta, the serving size refers to the amount of cooked product, not dry.  Check the total fat in packaged foods. Avoid foods that have saturated fat or trans fats.  Check the ingredients list for added sugars, such as corn syrup.   Shopping  At the grocery store, buy most of your food from the areas near the walls of the store. This includes: ? Fresh fruits and vegetables (produce). ? Grains, beans, nuts, and seeds. Some of these may be available in unpackaged forms or large amounts (in bulk). ? Fresh seafood. ? Poultry and eggs. ? Low-fat dairy products.  Buy whole ingredients instead of  prepackaged foods.  Buy fresh fruits and vegetables in-season from local farmers markets.  Buy frozen fruits and vegetables in resealable bags.  If you do not have access to quality fresh seafood, buy precooked frozen shrimp or canned fish, such as tuna, salmon, or sardines.  Buy small amounts of raw or cooked vegetables, salads, or olives from the deli or salad bar at your store.  Stock your pantry so you always have certain foods on hand, such as olive oil, canned tuna, canned tomatoes, rice, pasta, and beans. Cooking  Cook foods with extra-virgin olive oil instead of using butter or other vegetable oils.  Have meat as a side dish, and have vegetables or grains as your main dish. This means having meat in small portions or adding small amounts of meat to foods like pasta or stew.  Use beans or vegetables instead of meat in common dishes like chili or lasagna.  Experiment with different cooking methods. Try roasting or broiling vegetables instead of steaming or sauteing them.  Add frozen vegetables to soups, stews, pasta, or rice.  Add nuts or seeds for added healthy fat at each meal. You can add these to yogurt, salads, or vegetable dishes.  Marinate fish or vegetables using olive oil, lemon juice, garlic, and fresh herbs. Meal planning  Plan to  eat 1 vegetarian meal one day each week. Try to work up to 2 vegetarian meals, if possible.  Eat seafood 2 or more times a week.  Have healthy snacks readily available, such as: ? Vegetable sticks with hummus. ? [place] yogurt. ? Fruit and nut trail mix.  Eat balanced meals throughout the week. This includes: ? Fruit: 2-3 servings a day ? Vegetables: 4-5 servings a day ? Low-fat dairy: 2 servings a day ? Fish, poultry, or lean meat: 1 serving a day ? Beans and legumes: 2 or more servings a week ? Nuts and seeds: 1-2 servings a day ? Whole grains: 6-8 servings a day ? Extra-virgin olive oil: 3-4 servings a day  Limit red meat and sweets to only a few servings a month   What are my food choices?  Mediterranean diet ? Recommended  Grains: Whole-grain pasta. Brown rice. Bulgar wheat. Polenta. Couscous. Whole-wheat bread. [place].  Vegetables: Artichokes. Beets. Broccoli. Cabbage. Carrots. Eggplant. Green beans. Chard. Kale. Spinach. Onions. Leeks. Peas. Squash. Tomatoes. Peppers. Radishes.  Fruits: Apples. Apricots. Avocado. Berries. Bananas. Cherries. Dates. Figs. Grapes. Lemons. Melon. Oranges. Peaches. Plums. Pomegranate.  Meats and other protein foods: Beans. Almonds. Sunflower seeds. Pine nuts. Peanuts. Diablock. Salmon. Scallops. Shrimp. Ferry. Tilapia. Clams. Oysters. Eggs.  Dairy: Low-fat milk. Cheese. Greek yogurt.  Beverages: Water. Red wine. Herbal tea.  Fats and oils: Extra virgin olive oil. Avocado oil. Grape seed oil.  Sweets and desserts: [place] yogurt with honey. Baked apples. Poached pears. Trail mix.  Seasoning and other foods: Basil. Cilantro. Coriander. Cumin. Mint. Parsley. Sage. Rosemary. Tarragon. Garlic. Oregano. Thyme. Pepper. Balsalmic vinegar. Tahini. Hummus. Tomato sauce. Olives. Mushrooms. ? Limit these  Grains: Prepackaged pasta or rice dishes. Prepackaged cereal with added sugar.  Vegetables: Deep fried potatoes (french fries).  Fruits: Fruit  canned in syrup.  Meats and other protein foods: Beef. Pork. Lamb. Poultry with skin. Hot dogs. [place] Salines.  Dairy: Ice cream. Sour cream. Whole milk.  Beverages: Juice. Sugar-sweetened soft drinks. Beer. Liquor and spirits.  Fats and oils: Butter. Canola oil. Vegetable oil. Beef fat (tallow). Lard.  Sweets and desserts: Cookies. Cakes. Pies. Candy.  Seasoning and other foods: Mayonnaise.  Premade sauces and marinades. The items listed may not be a complete list. Talk with your dietitian about what dietary choices are right for you. Summary  The Mediterranean diet includes both food and lifestyle choices.  Eat a variety of fresh fruits and vegetables, beans, nuts, seeds, and whole grains.  Limit the amount of red meat and sweets that you eat.  Talk with your health care provider about whether it is safe for you to drink red wine in moderation. This means 1 glass a day for nonpregnant women and 2 glasses a day for men. A glass of wine equals 5 oz (150 mL). This information is not intended to replace advice given to you by your health care provider. Make sure you discuss any questions you have with your health care provider. Document Revised: 07/12/2016 Document Reviewed: 07/05/2016 Elsevier Patient Education  [place].

## 2021-01-12 ENCOUNTER — Encounter: Payer: Self-pay | Admitting: Family Medicine

## 2021-01-12 ENCOUNTER — Ambulatory Visit (INDEPENDENT_AMBULATORY_CARE_PROVIDER_SITE_OTHER): Payer: Medicare Other | Admitting: Family Medicine

## 2021-01-12 ENCOUNTER — Other Ambulatory Visit: Payer: Self-pay

## 2021-01-12 VITALS — BP 120/70 | HR 56 | Ht 73.0 in | Wt 221.2 lb

## 2021-01-12 DIAGNOSIS — R7303 Prediabetes: Secondary | ICD-10-CM

## 2021-01-12 DIAGNOSIS — Z125 Encounter for screening for malignant neoplasm of prostate: Secondary | ICD-10-CM | POA: Diagnosis not present

## 2021-01-12 DIAGNOSIS — Z23 Encounter for immunization: Secondary | ICD-10-CM | POA: Diagnosis not present

## 2021-01-12 DIAGNOSIS — E785 Hyperlipidemia, unspecified: Secondary | ICD-10-CM | POA: Diagnosis not present

## 2021-01-12 DIAGNOSIS — D692 Other nonthrombocytopenic purpura: Secondary | ICD-10-CM

## 2021-01-12 DIAGNOSIS — I10 Essential (primary) hypertension: Secondary | ICD-10-CM | POA: Diagnosis not present

## 2021-01-12 DIAGNOSIS — Z0000 Encounter for general adult medical examination without abnormal findings: Secondary | ICD-10-CM | POA: Diagnosis not present

## 2021-01-12 DIAGNOSIS — R5383 Other fatigue: Secondary | ICD-10-CM | POA: Diagnosis not present

## 2021-01-12 DIAGNOSIS — R413 Other amnesia: Secondary | ICD-10-CM | POA: Diagnosis not present

## 2021-01-12 DIAGNOSIS — G4700 Insomnia, unspecified: Secondary | ICD-10-CM

## 2021-01-13 ENCOUNTER — Encounter: Payer: Self-pay | Admitting: Family Medicine

## 2021-01-13 LAB — CBC WITH DIFFERENTIAL/PLATELET
Basophils Absolute: 0 10*3/uL (ref 0.0–0.2)
Basos: 1 %
EOS (ABSOLUTE): 0.2 10*3/uL (ref 0.0–0.4)
Eos: 4 %
Hematocrit: 38.9 % (ref 37.5–51.0)
Hemoglobin: 12.5 g/dL — ABNORMAL LOW (ref 13.0–17.7)
Immature Grans (Abs): 0 10*3/uL (ref 0.0–0.1)
Immature Granulocytes: 0 %
Lymphocytes Absolute: 1.3 10*3/uL (ref 0.7–3.1)
Lymphs: 22 %
MCH: 29.4 pg (ref 26.6–33.0)
MCHC: 32.1 g/dL (ref 31.5–35.7)
MCV: 92 fL (ref 79–97)
Monocytes Absolute: 0.6 10*3/uL (ref 0.1–0.9)
Monocytes: 9 %
Neutrophils Absolute: 3.8 10*3/uL (ref 1.4–7.0)
Neutrophils: 64 %
Platelets: 211 10*3/uL (ref 150–450)
RBC: 4.25 x10E6/uL (ref 4.14–5.80)
RDW: 12.3 % (ref 11.6–15.4)
WBC: 5.9 10*3/uL (ref 3.4–10.8)

## 2021-01-13 LAB — COMPREHENSIVE METABOLIC PANEL
ALT: 18 IU/L (ref 0–44)
AST: 30 IU/L (ref 0–40)
Albumin/Globulin Ratio: 2 (ref 1.2–2.2)
Albumin: 4.5 g/dL (ref 3.8–4.8)
Alkaline Phosphatase: 112 IU/L (ref 44–121)
BUN/Creatinine Ratio: 18 (ref 10–24)
BUN: 17 mg/dL (ref 8–27)
Bilirubin Total: 0.4 mg/dL (ref 0.0–1.2)
CO2: 20 mmol/L (ref 20–29)
Calcium: 8.9 mg/dL (ref 8.6–10.2)
Chloride: 103 mmol/L (ref 96–106)
Creatinine, Ser: 0.93 mg/dL (ref 0.76–1.27)
GFR calc Af Amer: 96 mL/min/{1.73_m2} (ref >59)
GFR calc non Af Amer: 83 mL/min/{1.73_m2} (ref >59)
Globulin, Total: 2.3 g/dL (ref 1.5–4.5)
Glucose: 107 mg/dL — ABNORMAL HIGH (ref 65–99)
Potassium: 4.3 mmol/L (ref 3.5–5.2)
Sodium: 140 mmol/L (ref 134–144)
Total Protein: 6.8 g/dL (ref 6.0–8.5)

## 2021-01-13 LAB — HEMOGLOBIN A1C
Est. average glucose Bld gHb Est-mCnc: 123 mg/dL
Hgb A1c MFr Bld: 5.9 % — ABNORMAL HIGH (ref 4.8–5.6)

## 2021-01-13 LAB — LIPID PANEL
Chol/HDL Ratio: 3.9 ratio (ref 0.0–5.0)
Cholesterol, Total: 185 mg/dL (ref 100–199)
HDL: 47 mg/dL (ref >39)
LDL Chol Calc (NIH): 124 mg/dL — ABNORMAL HIGH (ref 0–99)
Triglycerides: 77 mg/dL (ref 0–149)
VLDL Cholesterol Cal: 14 mg/dL (ref 5–40)

## 2021-01-13 LAB — TSH: TSH: 2.69 u[IU]/mL (ref 0.450–4.500)

## 2021-01-13 LAB — PSA: Prostate Specific Ag, Serum: 1.7 ng/mL (ref 0.0–4.0)

## 2021-01-19 ENCOUNTER — Other Ambulatory Visit: Payer: Self-pay | Admitting: *Deleted

## 2021-01-19 ENCOUNTER — Telehealth: Payer: Self-pay

## 2021-01-19 ENCOUNTER — Other Ambulatory Visit (INDEPENDENT_AMBULATORY_CARE_PROVIDER_SITE_OTHER): Payer: Medicare Other

## 2021-01-19 DIAGNOSIS — Z20822 Contact with and (suspected) exposure to covid-19: Secondary | ICD-10-CM

## 2021-01-19 LAB — POC COVID19 BINAXNOW: SARS Coronavirus 2 Ag: NEGATIVE

## 2021-01-19 NOTE — Telephone Encounter (Signed)
Pt wife was advised of negative test results  . [place]

## 2021-01-20 LAB — SARS-COV-2, NAA 2 DAY TAT

## 2021-01-20 LAB — NOVEL CORONAVIRUS, NAA: SARS-CoV-2, NAA: NOT DETECTED

## 2021-02-16 ENCOUNTER — Other Ambulatory Visit: Payer: Self-pay | Admitting: Family Medicine

## 2021-02-16 DIAGNOSIS — I10 Essential (primary) hypertension: Secondary | ICD-10-CM

## 2021-03-06 ENCOUNTER — Encounter: Payer: Self-pay | Admitting: Family Medicine

## 2021-03-06 ENCOUNTER — Encounter: Payer: Self-pay | Admitting: *Deleted

## 2021-03-07 DIAGNOSIS — S83282A Other tear of lateral meniscus, current injury, left knee, initial encounter: Secondary | ICD-10-CM | POA: Diagnosis not present

## 2021-03-14 DIAGNOSIS — M25562 Pain in left knee: Secondary | ICD-10-CM | POA: Diagnosis not present

## 2021-06-30 ENCOUNTER — Encounter: Payer: Self-pay | Admitting: Family Medicine

## 2021-07-05 NOTE — Patient Instructions (Signed)
   Altitude Sickness Altitude sickness occurs when a person goes to a high altitude without first letting the body adjust (acclimate) to the higher altitude. Depending on the severity, altitude sickness can be amedical emergency. It can develop into a life-threatening condition. What are the causes? This condition is caused by rapidly going to an altitude of at least 8,200 ft (2,460 m) above sea level. At this altitude, the air pressure and oxygen levelsare lower. What increases the risk? This condition can happen to anyone, regardless of physical condition. However, you are more likely to develop the condition if: You go to a high altitude quickly and are physically activeat that altitude. This may include exercising or moving a lot. You have a chronic breathing (respiratory) condition. What are the signs or symptoms? Symptoms of this condition usually develop within 72 hours of arriving at the high altitude. Symptoms of this condition include: A severe headache. Nausea and vomiting. Shortness of breath. Dizziness. Confusion. Uncoordinated movements. Fatigue. Trouble sleeping. Weakness. Hallucinations. How is this diagnosed? This condition may be diagnosed based on a physical exam and your medicalhistory. Sometimes a chest X-ray is taken. How is this treated? Treatment for this condition depends on the severity of the condition. In mild cases, treatment may not be needed as symptoms will gradually go away on their own in 3-5 days. In more serious cases, you may need treatment. To treat the condition: You will be moved to an altitude of 1,800 ft (540 m) above sea level or lower as quickly and safely as possible. You may also be given oxygen and medicines to help with breathing. If your condition is severe, you may need to stay in the hospital. Follow these instructions at home:  If you must exercise, do only light exercise for the first 24-36 hours after treatment. Drink enough  fluids to keep your urine pale yellow. Eat small, light meals. Avoid: Taking calming medicines (sedatives). Alcohol. Do not use any products that contain nicotine or tobacco, such as cigarettes and e-cigarettes. If you need help quitting, ask your health care provider. Stay at a low altitude until your symptoms improve. Have someone stay with you until you feel stable. Keep all follow-up visits as told by your health care provider. This is important. How is this prevented? Go to higher altitudes slowly, giving your body time to acclimate. Go to higher altitudes during the daytime and return to lower altitudes at night. Give your body a few days to adjust to a change in altitude before starting physical activities that require great effort. Ask your health care provider about medicines you can take to prevent altitude sickness. Get help right away if: You have: Chest pain or tightness. A fast heartbeat. A severe headache. A severe cough. Difficulty walking. Difficulty concentrating. Severe shortness of breath at rest or with exertion. You feel confused. Summary Altitude sickness occurs when a person goes to a high altitude without first letting the body adjust to the higher altitude. Symptoms include headache, nausea, vomiting, confusion, and shortness of breath. Depending on the severity, altitude sickness can be a medical emergency. It can develop into a life-threatening condition. Get medical help right away if you have chest pain, a fast heartbeat, trouble breathing, or you feel confused. This information is not intended to replace advice given to you by your health care provider. Make sure you discuss any questions you have with your healthcare provider. Document Revised: 03/06/2018 Document Reviewed: 03/06/2018 Elsevier Patient Education  [place].

## 2021-07-05 NOTE — Progress Notes (Signed)
Chief Complaint  Patient presents with   Consult    Reaction to altitude, felt very SOB at 12,532f. Wonders if he needs cardiac workup. Needs Cialis refill.     Message from 8/5: "[place] went [place]  [place] spent a week in the [place] before I started (3 days at 5000 feet; 4 at 8900).  When I started my hike, I walked up to 11,500 and camped.  The next morning, I started huffing and puffing as soon as started moving .  Never felt like I was getting enough oxygen.  I wasn't able to walk more than 200 feet or so without sitting down.  Eventually I decide to call 911, search and rescue gave me a place to meet them and a couple of hikers helped get down to them.  I was breathing extremely heavy the whole time.  Search and rescue gave oxygen and I was to walk 2 miles to the road.  Never felt any sort of cardiac issue.  That's all back ground. Should have a stress test to see if there be a cardiac issue behind the extreme reaction to the altitude?  PS: getting the meniscus of my left knee fixed on 9/7"  Patient presents to discuss the events that occurred on his trip. As soon as he woke up at 11,503f(on 7/28) with any movement he was out of breath. Felt fine at rest.  He met up with search and rescue, and was supplied an oxygen tank. They warmed him up, gave him oxygen.  Then was able to walk 2 miles to the jeep (prev had to stop ever 200 steps or so). At base, O2 sat 95-97%, pulse 90's (after jeep ride not walking--he was surprised at this).  He denies having had any headaches. No nausea, vomiting. No dizziness or confusion.  He did have a runny nose in the tent, and in the hotel afterwards.  Had some bouts of sneezing too. He wondered if it could be COVID, but he has taken 2 COVID tests since then (the day after the trip and 2 days later), and both were negative. He "napped for days" after this, was wiped out. He feels fine now.  When much younger, had no issues at even higher elevations, so  wondering if this is cause for concern about any blockages. He denies any DOE, chest pain or palpitations with any other activity, just that day when at 11.5K feet and higher elevation.  Chart reviewed--Hg was down to 12.5 on last check, which was a couple of weeks after donating power red.  He reports that his last blood donation (power red) was 7/5 (2 weeks before going to [place], 3 weeks before episode). Stated that Hg was 14.4 prior to donation.  Wants to know recommended weight for him.  Got down to 203# with noom in the past.   PMH, PSH, SH reviewed    BP 130/88   Pulse 64   Ht _0  (1.803 m)   Wt 224 lb (101.6 kg)   BMI 31.24 kg/m  Wt Readings from Last 3 Encounters:  07/06/21 224 lb (101.6 kg)  01/12/21 221 lb 3.2 oz (100.3 kg)  11/15/20 215 lb (97.5 kg)   He expressed concern about height noted in chart--he was remeasured at 6'1".   Pleasant, well-appearing male, speaking easily, in no distress HEENT: conjunctiva and sclera are clear, EOMI, wearing mask. Neck:  no lymphadenopathy, thyromegaly or carotid bruit Heart: regular rate and rhythm, no  murmur Lungs: clear bilaterally Back: no spinal or CVA tenderness Abdomen: soft, nontender, no organomegaly or mass Extremities: no edema, normal pulses. Small recent abrasion L shin Psych: normal mood, affect, hygiene and grooming Neuro: alert and oriented, normal strength, gait  EKG--sinus bradycardia, rate 54.  Otherwise normal    ASSESSMENT/PLAN:  Shortness of breath - Occurred while in 11.5K ft of elevation in [place], resolved with descent. No other sx of altitude sickness. He is back to baseline now, denies DOE - Plan: CBC with Differential/Platelet, EKG 12-Lead  Essential hypertension, benign - cont losartan. Weight loss recommended (specifically at waist, where he carries a lot of weight)  Erectile dysfunction, unspecified erectile dysfunction type - Plan: tadalafil (CIALIS) 20 MG tablet  Reassured that EKG was  normal. Discussed that his blood donation a few weeks prior to altitude likely affecting his breathing more than he realized (lower the oxygen carrying potential with lower hemoglobin). Don't recommend donating blood within 1-2 months of altitude hiking in future.  He states he doesn't plan to hike that high again. Advised that given normal EKG, lack of any symptoms when NOT at elevation, that unless he develops symptoms or plans to exercise at elevation again, he doesn't necessarily need a stress test.  Will check CBC today.

## 2021-07-06 ENCOUNTER — Other Ambulatory Visit: Payer: Self-pay

## 2021-07-06 ENCOUNTER — Encounter: Payer: Self-pay | Admitting: Family Medicine

## 2021-07-06 ENCOUNTER — Ambulatory Visit (INDEPENDENT_AMBULATORY_CARE_PROVIDER_SITE_OTHER): Payer: Medicare Other | Admitting: Family Medicine

## 2021-07-06 VITALS — BP 130/88 | HR 64 | Ht 73.0 in | Wt 224.0 lb

## 2021-07-06 DIAGNOSIS — N529 Male erectile dysfunction, unspecified: Secondary | ICD-10-CM | POA: Diagnosis not present

## 2021-07-06 DIAGNOSIS — R0602 Shortness of breath: Secondary | ICD-10-CM | POA: Diagnosis not present

## 2021-07-06 DIAGNOSIS — I10 Essential (primary) hypertension: Secondary | ICD-10-CM | POA: Diagnosis not present

## 2021-07-06 LAB — CBC WITH DIFFERENTIAL/PLATELET
Basophils Absolute: 0.1 10*3/uL (ref 0.0–0.2)
Basos: 1 %
EOS (ABSOLUTE): 0.2 10*3/uL (ref 0.0–0.4)
Eos: 3 %
Hematocrit: 38.4 % (ref 37.5–51.0)
Hemoglobin: 12.8 g/dL — ABNORMAL LOW (ref 13.0–17.7)
Immature Grans (Abs): 0 10*3/uL (ref 0.0–0.1)
Immature Granulocytes: 0 %
Lymphocytes Absolute: 1.6 10*3/uL (ref 0.7–3.1)
Lymphs: 28 %
MCH: 29.2 pg (ref 26.6–33.0)
MCHC: 33.3 g/dL (ref 31.5–35.7)
MCV: 88 fL (ref 79–97)
Monocytes Absolute: 0.5 10*3/uL (ref 0.1–0.9)
Monocytes: 9 %
Neutrophils Absolute: 3.4 10*3/uL (ref 1.4–7.0)
Neutrophils: 59 %
Platelets: 251 10*3/uL (ref 150–450)
RBC: 4.38 x10E6/uL (ref 4.14–5.80)
RDW: 12.4 % (ref 11.6–15.4)
WBC: 5.7 10*3/uL (ref 3.4–10.8)

## 2021-07-06 MED ORDER — TADALAFIL 20 MG PO TABS: 10.0000 mg | ORAL_TABLET | ORAL | 11 refills | Status: DC | PRN

## 2021-08-02 DIAGNOSIS — S83282A Other tear of lateral meniscus, current injury, left knee, initial encounter: Secondary | ICD-10-CM | POA: Diagnosis not present

## 2021-08-02 DIAGNOSIS — M94262 Chondromalacia, left knee: Secondary | ICD-10-CM | POA: Diagnosis not present

## 2021-08-02 DIAGNOSIS — G8918 Other acute postprocedural pain: Secondary | ICD-10-CM | POA: Diagnosis not present

## 2021-08-02 DIAGNOSIS — S83242A Other tear of medial meniscus, current injury, left knee, initial encounter: Secondary | ICD-10-CM | POA: Diagnosis not present

## 2021-08-02 HISTORY — PX: KNEE ARTHROSCOPY: SUR90

## 2021-08-07 ENCOUNTER — Encounter: Payer: Self-pay | Admitting: Family Medicine

## 2021-08-23 ENCOUNTER — Telehealth: Payer: Self-pay | Admitting: Family Medicine

## 2021-08-23 NOTE — Telephone Encounter (Signed)
Received requested records from [facility]

## 2021-08-28 ENCOUNTER — Encounter: Payer: Self-pay | Admitting: Family Medicine

## 2021-08-30 ENCOUNTER — Other Ambulatory Visit: Payer: Self-pay | Admitting: Family Medicine

## 2021-08-30 DIAGNOSIS — I10 Essential (primary) hypertension: Secondary | ICD-10-CM

## 2021-09-05 DIAGNOSIS — M65332 Trigger finger, left middle finger: Secondary | ICD-10-CM | POA: Diagnosis not present

## 2021-09-13 ENCOUNTER — Encounter (INDEPENDENT_AMBULATORY_CARE_PROVIDER_SITE_OTHER): Payer: Medicare Other | Admitting: Ophthalmology

## 2021-09-13 ENCOUNTER — Other Ambulatory Visit: Payer: Self-pay

## 2021-09-13 DIAGNOSIS — H353132 Nonexudative age-related macular degeneration, bilateral, intermediate dry stage: Secondary | ICD-10-CM

## 2021-09-13 DIAGNOSIS — H3553 Other dystrophies primarily involving the sensory retina: Secondary | ICD-10-CM | POA: Diagnosis not present

## 2021-09-13 DIAGNOSIS — I10 Essential (primary) hypertension: Secondary | ICD-10-CM

## 2021-09-13 DIAGNOSIS — H35033 Hypertensive retinopathy, bilateral: Secondary | ICD-10-CM

## 2021-09-13 DIAGNOSIS — H43813 Vitreous degeneration, bilateral: Secondary | ICD-10-CM

## 2021-11-23 ENCOUNTER — Encounter: Payer: Self-pay | Admitting: Family Medicine

## 2022-01-01 DIAGNOSIS — M65332 Trigger finger, left middle finger: Secondary | ICD-10-CM | POA: Diagnosis not present

## 2022-01-18 ENCOUNTER — Ambulatory Visit: Payer: Medicare Other | Admitting: Family Medicine

## 2022-02-25 ENCOUNTER — Other Ambulatory Visit: Payer: Self-pay | Admitting: Family Medicine

## 2022-02-25 DIAGNOSIS — I10 Essential (primary) hypertension: Secondary | ICD-10-CM

## 2022-03-01 ENCOUNTER — Encounter: Payer: Self-pay | Admitting: Family Medicine

## 2022-03-07 NOTE — Patient Instructions (Addendum)
?HEALTH MAINTENANCE RECOMMENDATIONS: ? ?It is recommended that you get at least 30 minutes of aerobic exercise at least 5 days/week (for weight loss, you may need as much as 60-90 minutes). This can be any activity that gets your heart rate up. This can be divided in 10-15 minute intervals if needed, but try and build up your endurance at least once a week.  Weight bearing exercise is also recommended twice weekly. ? ?Eat a healthy diet with lots of vegetables, fruits and fiber.  "Colorful" foods have a lot of vitamins (ie green vegetables, tomatoes, red peppers, etc).  Limit sweet tea, regular sodas and alcoholic beverages, all of which has a lot of calories and sugar.  Up to 2 alcoholic drinks daily may be beneficial for men (unless trying to lose weight, watch sugars).  Drink a lot of water. ? ?Sunscreen of at least SPF 30 should be used on all sun-exposed parts of the skin when outside between the hours of 10 am and 4 pm (not just when at beach or pool, but even with exercise, golf, tennis, and yard work!)  Use a sunscreen that says "broad spectrum" so it covers both UVA and UVB rays, and make sure to reapply every 1-2 hours. ? ?Remember to change the batteries in your smoke detectors when changing your clock times in the spring and fall.  Carbon monoxide detectors are recommended for your home. ? ?Use your seat belt every time you are in a car, and please drive safely and not be distracted with cell phones and texting while driving. ? ? ? ?Mr. Linsey , ?Thank you for taking time to come for your Medicare Wellness Visit. I appreciate your ongoing commitment to your health goals. Please review the following plan we discussed and let me know if I can assist you in the future.  ? ?This is a list of the screening recommended for you and due dates:  ?Health Maintenance  ?Topic Date Due  ? Zoster (Shingles) Vaccine (1 of 2) Never done  ? COVID-19 Vaccine (4 - Booster for Pfizer series) 11/20/2020  ? Tetanus Vaccine   01/01/2022  ? Flu Shot  06/26/2022  ? Colon Cancer Screening  07/2023 (5 years from the last?)  ? Pneumonia Vaccine  Completed  ? Hepatitis C Screening: USPSTF Recommendation to screen - Ages 6-79 yo.  Completed  ? HPV Vaccine  Aged Out  ? ?You are due for tetanus booster (TdaP), which you need to get from the pharmacy. ? ? ?Please bring [place] copies of your Living Will and [place] once completed and notarized so that it can be scanned into your medical chart. ? ?Your blood pressure is often lower than it needs to be.  You have lost a lot of weight and I'm concerned that especially in the summer with heat/humidity, with any slight dehydration, you will have lower BP, feel dizzy and potentially have a negative outcome from it. ?Since you had higher blood pressure recently when you stopped it, try taking 1/2 tablet once daily for a few weeks. If BP overall is <130-135/80-85, and only rarely over 130/80, it is fine to try stopping the losartan entirely. ?Feel free to send me a mychart message with your blood pressure list (send photo of the page). ? ?For your stomach/bowels--try dairy-free diet (or taking Lactaid tablet prior to ice cream, but I recommend not having ice cream daily). ?Continue the probiotic daily. ? ?Drink plenty of water when you eat (especially bananas!). ? ?  Dosing for cialis is usually 5 mg daily, or '20mg'$  every 3 days (as needed). ?You can try 1/2 tablet every other day of the same '20mg'$  tablets that you have, and it should still work well for you. If you want the lower dose, let us know. ? ?

## 2022-03-07 NOTE — Progress Notes (Signed)
?Chief Complaint  ?Patient presents with  ? Medicare Wellness  ?  Fasting med check plus/AWV. Having some issues with his bp. Also some GI issues. Having gaseous bowel movements multiple times a day-started taking a probiotic in Feb and it has helped only some. [facility] states he had shingrix 01/27/21, he believes he went back for #2-I will call and verify. Has not had Tdap.   ? ? ?Dillon Caldwell is a 71 y.o. male who presents for annual wellness visit and follow-up on chronic medical conditions.  ?He has [place] Medicare, cannot have complete physical same day. ? ?He is planning to have L middle finger trigger surgery soon with Dr. Mardelle Matte. ? ?He has been having issues with multiple gassy bowel movements daily.   ?Has [place] ice cream sandwiches daily (200 cal). ?Also has bouts of constipation (rabbit pellets, mucousy). Some explosive farts, mucousy stools.  This has been better in the last 2 weeks, but had been daily for months prior. He started a probiotic in February and it improved.  He didn't take it for 3 weeks that he was in [place]. He restarted it the end of March. Bowels have been better x 2wks. ?He drinks 8 glasses of water daily, 2 cups of coffee, rare alcohol. ? ?Hypertension:  He had been able to get off medication in 08/2012, but was restarted on losartan '25mg'$  by PCP in [place] in 11/2019 (had gained weight).  ?He continues on '25mg'$  of losartan, denies side effects. ?He recently sent message about a low BP and P 91. (He was advised to stay well hydrated and bring list of BP's to visit). ?Blood pressures are running (per memory on his monitor brought in today--accurate) ?111/71, 108/72, 91/60 (P86), 95/60 P88, 125/80, 116/68, 101/61, 109/69, 99/59 P52, 121/72, 131/85 (not recent). ?He has had some very short-lived "waves" of dizziness when it is low (once while standing on [employer], political party, exercising strenuously; didn't check BP at that time). ?No dizziness on the days he noted the lower BP's. ?He intentionally  stopped medication for a few days after his recent message with low BP.  One was 527 systolic, so he restarted it. ?Denies headaches, chest pain, side effects. No edema. ? ?He lost weight with Noom (logging food, 10K steps, daily weight). ?Got to current weight in October and has maintained it. No longer logging food, but continuing daily weights and steps. ?  ?GERD--No longer having issues with belching and heartburn. No longer needs Prilosec. ?In the past has seen ENT (Dr. Redmond Baseman) for globus sensation; he sometimes will have a pill get hung up in throat--this is very infrequent, and relieved by drinking water. ?Last week coughed up a chunk of banana about 45 minutes after eating it. Overall not having significant issues. ? ?Insomnia:  Taking Hemp gummies at bedtime. R knee pain flares in bed, to he has been taking Tylenol PM every night.  He is sleeping much better now (6-7 hours). ? ?He has no problems sleeping when hiking (11 hours/day on the trail) ? ?ED: He is taking cialis '20mg'$  every other day with good results. ?  ?Dyslipidemia--He previously took an OTC no-flush niacin once daily, and fish oil '1000mg'$  once daily. He stopped taking these a few years ago when logistically it became difficult [place].  He reports always having low HDL, even when very active, running marathons.  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 ?He reports his lipids have been better ever since doing the long hiking. ?  He tries to follow a lowfat, low cholesterol diet and gets regular exercise. ?Lab Results  ?Component Value Date  ? CHOL 185 01/12/2021  ? HDL 47 01/12/2021  ? LDLCALC 124 (H) 01/12/2021  ? TRIG 77 01/12/2021  ? CHOLHDL 3.9 01/12/2021  ? ? ?Prediabetes--A1c was 6.1% in 11/2019, with fglu of 111. Last year A1c was down to 5.9%, fglu 107. ?He doesn't eat bread, rice, only small portions of pasta. ?+ice cream sandwich daily, no other sweets. ?He remains physically active. ?Lab Results  ?Component  Value Date  ? HGBA1C 5.9 (H) 01/12/2021  ? ? ? ?Immunization History  ?Administered Date(s) Administered  ? Influenza Split 09/08/2012  ? Influenza, High Dose Seasonal PF 12/01/2015, 07/27/2020  ? Influenza,inj,Quad PF,6+ Mos 09/24/2013, 08/30/2014  ? Moderna Sars-Covid-2 Vaccination 05/02/2021  ? PFIZER(Purple Top)SARS-COV-2 Vaccination 02/04/2020, 02/25/2020, 09/25/2020  ? Pension scheme manager 45yr & up 09/06/2021  ? Pneumococcal Conjugate-13 06/27/2016  ? Pneumococcal Polysaccharide-23 01/12/2021  ? Tdap 01/02/2012  ? Zoster Recombinat (Shingrix) 01/27/2021, 04/08/2021  ? Zoster, Live 12/03/2012  ? ?Last [place] ?Last PSA: ?Lab Results  ?Component Value Date  ? PSA1 1.7 01/12/2021  ? PSA 1.69 12/22/2019  ? PSA 1.45 06/27/2016  ? PSA 1.88 12/30/2014  ? ?Dentist: Dr. ROrlando Pennertwo times/year ?Ophtho: yearly ?Exercise:  running 5 days/week, walking 2 days/week (might differ sometimes, depending on how he feels). [place] biking 1-2xweek, and Pilates once a week ? ? ?Patient Care Team: ?KRita Ohara MD as PCP - General (Family Medicine) ?Dentist: Dr. ROrlando Penner?Ophtho: [facility] [address], and Dr. MZigmund Daniel(retinal specialist) ?ENT: Dr. BRedmond Baseman?Ortho: Dr. WNoemi Chapel Dr. LMardelle Matte(for trigger finger) ? ?Depression Screening: [place] ?PHQ-2 Total Score 0  ? ?  ?  ? ? ?Falls screen:  ? ?  03/08/2022  ?  9:37 AM 07/06/2021  ?  1:35 PM 01/12/2021  ?  9:46 AM 06/27/2016  ?  9:04 AM  ?Fall Risk   ?Falls in the past year? 1 0 1 Yes  ?Number falls in past yr: 1 0 1 2 or more  ?Comment running on both, tripped on curb. 3/23 and 11/22     ?Injury with Fall? 0 0 1 No  ?Comment bloody nose  cracked a rib on right side   ?Risk for fall due to : History of fall(s) No Fall Risks    ?Follow up Falls evaluation completed Falls evaluation completed    ?  ? ?Functional Status Survey: ?Is the patient deaf or  have difficulty hearing?: Yes (B/l hearing aides) ?Does the patient have difficulty seeing, even when wearing glasses/contacts?: Yes (night vision is not as good) ?Does the patient have difficulty concentrating, remembering, or making decisions?: No ?Does the patient have difficulty walking or climbing stairs?: No ?Does the patient have difficulty dressing or bathing?: No ?Does the patient have difficulty doing errands alone such as visiting a doctor's office or shopping?: No ? ?Mini-Cog Scoring: 4  Got the 3 words 30 minutes later during visit. ? ? ?End of Life Discussion:  Patient has a living will and medical power of attorney ? ?PMH, PSH, SH and FH were reviewed and updated ?Brother with glioblastoma--he had done well for a year (s/p surgery, reportedly clear MRI), but has had recurrent tumor.  Is in a clinical trial. [place] ? ? ?Outpatient Encounter Medications as of  03/08/2022  ?Medication Sig Note  ? diphenhydramine-acetaminophen (TYLENOL PM) 25-500 MG TABS tablet Take 1 tablet by mouth at bedtime.   ? fluticasone (FLONASE) 50 MCG/ACT nasal spray Place 1 spray into both nostrils daily.   ? losartan (COZAAR) 25 MG tablet TAKE ONE TABLET BY MOUTH DAILY   ? Probiotic Product (PROBIOTIC DAILY PO) Take 1 capsule by mouth daily.   ? tadalafil (CIALIS) 20 MG tablet Take 0.5-1 tablets (10-20 mg total) by mouth every other day as needed for erectile dysfunction.   ? [DISCONTINUED] Naproxen Sod-Diphenhydramine (ALEVE PM PO) Take 2 tablets by mouth at bedtime. 01/12/2021: Takes either Tylenol PM or Aleve PM  ? [DISCONTINUED] losartan (COZAAR) 25 MG tablet TAKE ONE TABLET BY MOUTH DAILY   ? ?No facility-administered encounter medications on file as of 03/08/2022.  ? ?No Known Allergies ? ? ?ROS: The patient denies anorexia, fever, headaches, ear pain, hoarseness, chest pain, dizziness, syncope, dyspnea on exertion, cough, swelling, nausea, vomiting, abdominal pain, melena,  hematochezia, hematuria, nocturia, hesitance or weakened stream, incontinence, dysuria, genital lesions, joint pains, numbness, tingling, weakness, tremor, suspicious skin lesions, depression, anxiety, abnormal

## 2022-03-08 ENCOUNTER — Encounter: Payer: Self-pay | Admitting: Family Medicine

## 2022-03-08 ENCOUNTER — Encounter: Payer: Self-pay | Admitting: *Deleted

## 2022-03-08 ENCOUNTER — Ambulatory Visit (INDEPENDENT_AMBULATORY_CARE_PROVIDER_SITE_OTHER): Payer: Medicare Other | Admitting: Family Medicine

## 2022-03-08 VITALS — BP 120/82 | HR 56 | Ht 73.0 in | Wt 207.8 lb

## 2022-03-08 DIAGNOSIS — Z6827 Body mass index (BMI) 27.0-27.9, adult: Secondary | ICD-10-CM

## 2022-03-08 DIAGNOSIS — R7303 Prediabetes: Secondary | ICD-10-CM

## 2022-03-08 DIAGNOSIS — E785 Hyperlipidemia, unspecified: Secondary | ICD-10-CM | POA: Diagnosis not present

## 2022-03-08 DIAGNOSIS — D692 Other nonthrombocytopenic purpura: Secondary | ICD-10-CM

## 2022-03-08 DIAGNOSIS — Z0000 Encounter for general adult medical examination without abnormal findings: Secondary | ICD-10-CM | POA: Diagnosis not present

## 2022-03-08 DIAGNOSIS — Z125 Encounter for screening for malignant neoplasm of prostate: Secondary | ICD-10-CM

## 2022-03-08 DIAGNOSIS — I10 Essential (primary) hypertension: Secondary | ICD-10-CM | POA: Diagnosis not present

## 2022-03-09 LAB — CBC WITH DIFFERENTIAL/PLATELET
Basophils Absolute: 0 10*3/uL (ref 0.0–0.2)
Basos: 1 %
EOS (ABSOLUTE): 0.1 10*3/uL (ref 0.0–0.4)
Eos: 3 %
Hematocrit: 38.1 % (ref 37.5–51.0)
Hemoglobin: 12.2 g/dL — ABNORMAL LOW (ref 13.0–17.7)
Immature Grans (Abs): 0 10*3/uL (ref 0.0–0.1)
Immature Granulocytes: 0 %
Lymphocytes Absolute: 1.2 10*3/uL (ref 0.7–3.1)
Lymphs: 31 %
MCH: 27.6 pg (ref 26.6–33.0)
MCHC: 32 g/dL (ref 31.5–35.7)
MCV: 86 fL (ref 79–97)
Monocytes Absolute: 0.4 10*3/uL (ref 0.1–0.9)
Monocytes: 10 %
Neutrophils Absolute: 2.1 10*3/uL (ref 1.4–7.0)
Neutrophils: 55 %
Platelets: 208 10*3/uL (ref 150–450)
RBC: 4.42 x10E6/uL (ref 4.14–5.80)
RDW: 12.9 % (ref 11.6–15.4)
WBC: 3.8 10*3/uL (ref 3.4–10.8)

## 2022-03-09 LAB — PSA: Prostate Specific Ag, Serum: 2 ng/mL (ref 0.0–4.0)

## 2022-03-09 LAB — COMPREHENSIVE METABOLIC PANEL
ALT: 16 IU/L (ref 0–44)
AST: 25 IU/L (ref 0–40)
Albumin/Globulin Ratio: 2.3 — ABNORMAL HIGH (ref 1.2–2.2)
Albumin: 4.6 g/dL (ref 3.7–4.7)
Alkaline Phosphatase: 101 IU/L (ref 44–121)
BUN/Creatinine Ratio: 15 (ref 10–24)
BUN: 15 mg/dL (ref 8–27)
Bilirubin Total: 0.4 mg/dL (ref 0.0–1.2)
CO2: 23 mmol/L (ref 20–29)
Calcium: 9 mg/dL (ref 8.6–10.2)
Chloride: 105 mmol/L (ref 96–106)
Creatinine, Ser: 1.02 mg/dL (ref 0.76–1.27)
Globulin, Total: 2 g/dL (ref 1.5–4.5)
Glucose: 109 mg/dL — ABNORMAL HIGH (ref 70–99)
Potassium: 4.7 mmol/L (ref 3.5–5.2)
Sodium: 140 mmol/L (ref 134–144)
Total Protein: 6.6 g/dL (ref 6.0–8.5)
eGFR: 78 mL/min/{1.73_m2} (ref >59)

## 2022-03-09 LAB — LIPID PANEL
Chol/HDL Ratio: 3.2 ratio (ref 0.0–5.0)
Cholesterol, Total: 159 mg/dL (ref 100–199)
HDL: 49 mg/dL (ref >39)
LDL Chol Calc (NIH): 97 mg/dL (ref 0–99)
Triglycerides: 69 mg/dL (ref 0–149)
VLDL Cholesterol Cal: 13 mg/dL (ref 5–40)

## 2022-03-09 LAB — HEMOGLOBIN A1C
Est. average glucose Bld gHb Est-mCnc: 128 mg/dL
Hgb A1c MFr Bld: 6.1 % — ABNORMAL HIGH (ref 4.8–5.6)

## 2022-03-12 ENCOUNTER — Encounter: Payer: Self-pay | Admitting: Family Medicine

## 2022-03-22 DIAGNOSIS — M65332 Trigger finger, left middle finger: Secondary | ICD-10-CM | POA: Diagnosis not present

## 2022-03-22 HISTORY — PX: TRIGGER FINGER RELEASE: SHX641

## 2022-04-25 ENCOUNTER — Encounter: Payer: Self-pay | Admitting: Family Medicine

## 2022-05-26 ENCOUNTER — Other Ambulatory Visit: Payer: Self-pay | Admitting: Family Medicine

## 2022-05-26 DIAGNOSIS — I10 Essential (primary) hypertension: Secondary | ICD-10-CM

## 2022-08-01 ENCOUNTER — Encounter: Payer: Self-pay | Admitting: Internal Medicine

## 2022-08-29 ENCOUNTER — Other Ambulatory Visit: Payer: Self-pay | Admitting: Family Medicine

## 2022-08-29 DIAGNOSIS — I10 Essential (primary) hypertension: Secondary | ICD-10-CM

## 2022-09-04 ENCOUNTER — Encounter: Payer: Self-pay | Admitting: Internal Medicine

## 2022-09-11 NOTE — Patient Instructions (Incomplete)
  HEALTH MAINTENANCE RECOMMENDATIONS:  It is recommended that you get at least 30 minutes of aerobic exercise at least 5 days/week (for weight loss, you may need as much as 60-90 minutes). This can be any activity that gets your heart rate up. This can be divided in 10-15 minute intervals if needed, but try and build up your endurance at least once a week.  Weight bearing exercise is also recommended twice weekly.  Eat a healthy diet with lots of vegetables, fruits and fiber.  "Colorful" foods have a lot of vitamins (ie green vegetables, tomatoes, red peppers, etc).  Limit sweet tea, regular sodas and alcoholic beverages, all of which has a lot of calories and sugar.  Up to 2 alcoholic drinks daily may be beneficial for men (unless trying to lose weight, watch sugars).  Drink a lot of water.  Sunscreen of at least SPF 30 should be used on all sun-exposed parts of the skin when outside between the hours of 10 am and 4 pm (not just when at beach or pool, but even with exercise, golf, tennis, and yard work!)  Use a sunscreen that says "broad spectrum" so it covers both UVA and UVB rays, and make sure to reapply every 1-2 hours.  Remember to change the batteries in your smoke detectors when changing your clock times in the spring and fall.  Carbon monoxide detectors are recommended for your home.  Use your seat belt every time you are in a car, and please drive safely and not be distracted with cell phones and texting while driving.   I recommend getting the RSV vaccine from the pharmacy. Vaccines should be separated from each other by 2 weeks.  We are putting in a referral for an ultrasound of your kidneys (to see if you have 1 or 2). Work on continuing to limit sugar in the diet, high fiber diet. Use probiotics as needed for GI issues (gas, constipation/diarrhea).  Continue losartan.

## 2022-09-11 NOTE — Progress Notes (Unsigned)
No chief complaint on file.   Dillon Caldwell is a 71 y.o. male who presents annual exam and follow-up on chronic medical conditions.  He had AWV in 02/2022.  He had reported some GI complaints at his last visit--multiple gassy BMs/d, "explosive farts", mucousy stools, as well as bouts of constipation ("rabbit pellets").  Probiotic helped relieve these symptoms.  Hypertension:  He had been able to get off medication in 08/2012, but was restarted on losartan '25mg'$  by PCP in [place] in 11/2019 (had gained weight).  At his visit in April he reported some low BP's, and we had discussed trial of cutting in half, possibly tapering off, depending on BP's.  His monitor was verified as accurate.  He is currently taking  BP's are running  Denies headaches, chest pain, side effects. No edema.  He lost weight with Noom (logging food, 10K steps, daily weight). Has been maintaining weight over the last year (no longer logging food, but continuing daily weights and steps).   GERD--No longer having issues with belching and heartburn. No longer needs Prilosec. In the past has seen ENT (Dr. Redmond Baseman) for globus sensation; he sometimes will have a pill get hung up in throat--this is very infrequent, and relieved by drinking water. At his last visit he reported an episode where he had coughed up a chunk of banana about 45 minutes after eating it. Overall not having significant issues.  Insomnia:  Taking Hemp gummies at bedtime. Also takes Tylenol PM at night (R knee pain flares in bed), sleeping well.  ED: He is taking cialis '20mg'$  every other day with good results.   Dyslipidemia--  He reports always having low HDL, even when very active, running marathons. He reports his lipids have been better ever since doing the long hiking. He tries to follow a lowfat, low cholesterol diet and gets regular exercise. Last lipids were good: Lab Results  Component Value Date   CHOL 159 03/08/2022   HDL 49 03/08/2022    LDLCALC 97 03/08/2022   TRIG 69 03/08/2022   CHOLHDL 3.2 03/08/2022  (He previously took an OTC no-flush niacin once daily, and fish oil '1000mg'$  once daily. He stopped taking these a few years ago when logistically it became difficult [place].)  Prediabetes--A1c was 6.1% in 02/2022. At that time he had been having ice cream sandwiches daily, no other sweets. He doesn't eat bread, rice, only small portions of pasta. He remains physically active. Lab Results  Component Value Date   HGBA1C 6.1 (H) 03/08/2022     Immunization History  Administered Date(s) Administered   Influenza Split 09/08/2012   Influenza, High Dose Seasonal PF 12/01/2015, 07/27/2020   Influenza,inj,Quad PF,6+ Mos 09/24/2013, 08/30/2014   Moderna Sars-Covid-2 Vaccination 05/02/2021   PFIZER(Purple Top)SARS-COV-2 Vaccination 02/04/2020, 02/25/2020, 09/25/2020   Pfizer Covid-19 Vaccine Bivalent Booster 59yr & up 09/06/2021   Pneumococcal Conjugate-13 06/27/2016   Pneumococcal Polysaccharide-23 01/12/2021   Tdap 01/02/2012   Zoster Recombinat (Shingrix) 01/27/2021, 04/08/2021   Zoster, Live 12/03/2012   Last [place]: 07[place]2018[place] PSA: Lab Results  Component Value Date   PSA1 2.0 03/08/2022   PSA1 1.7 01/12/2021   PSA 1.69 12/22/2019   PSA 1.45 06/27/2016   PSA 1.88 12/30/2014   Dentist: Dr. ROrlando Pennertwo times/year Ophtho: yearly Exercise:   running 5 days/week, walking 2 days/week (might differ sometimes, depending on how he feels). [place] biking 1-2xweek, and Pilates once a week    PMH, PSH, SH  and FH were reviewed and updated Brother with glioblastoma--he had done well for a year (s/p surgery, reportedly clear MRI), but has had recurrent tumor.  Is in a clinical trial. He spent 3 weeks with his brother in [place] in Feb.     ROS: The patient denies anorexia, fever, headaches, ear pain, hoarseness, chest pain, dizziness, syncope, dyspnea on exertion,  cough, swelling, nausea, vomiting, abdominal pain, melena, hematochezia, hematuria, nocturia, hesitance or weakened stream, incontinence, dysuria, genital lesions, joint pains, numbness, tingling, weakness, tremor, suspicious skin lesions, depression, anxiety, abnormal bleeding/bruising, or enlarged lymph nodes. +insomnia, controlled with OTC, per HPI +hearing loss--wears hearing aids. Tinnitus bilaterally (chronic)  Bowels weight    PHYSICAL EXAM:  There were no vitals taken for this visit.  Wt Readings from Last 3 Encounters:  03/08/22 207 lb 12.8 oz (94.3 kg)  07/06/21 224 lb (101.6 kg)  01/12/21 221 lb 3.2 oz (100.3 kg)   General Appearance:   Alert, cooperative, no distress, appears stated age    Head:   Normocephalic, without obvious abnormality, atraumatic    Eyes:   PERRL, conjunctiva/corneas clear, EOM's intact, fundi   benign    Ears:   Wearing hearing aids bilaterally; TM's and EACs are normal upon their removal   Nose:   No drainage or sinus tenderness   Throat:   Normal mucosa, no lesions  Neck:   Supple, no lymphadenopathy; thyroid: no enlargement/ tenderness/nodules; no carotid bruit or JVD    Back:   Spine nontender, no curvature, ROM normal, no CVA tenderness    Lungs:   Clear to auscultation bilaterally without wheezes, rales or ronchi; respirations unlabored    Chest Wall:   No tenderness or deformity    Heart:   Bradycardic, regular rhythm, S1 and S2 normal, no murmur, rub   or gallop    Breast Exam:   No chest wall tenderness, masses or gynecomastia    Abdomen:   Soft, non-tender, nondistended, normoactive bowel sounds,   no masses, no hepatosplenomegaly. +abdominal obesity   Genitalia:   Normal male external genitalia without lesions. Testicles without masses. No inguinal hernias.    Rectal:   Normal sphincter tone, no masses or tenderness; guaiac negative stool. Prostate smooth, no nodules, not enlarged. Hemorrhoid seen/felt externally, that moved inside with  rectal exam, nontender.  Extremities:   No clubbing, cyanosis or edema.  Focal swelling at/above patella bilaterally R>L, nontender (bursitis; "knee boob" --chronic per pt)  Pulses:   2+ and symmetric all extremities    Skin:   Skin color, texture, turgor normal. 2-2.5 cm subcutanous, mobile soft tissue mass at the left upper temple area.  It is flesh colored, soft, mobile, no erythema.  Purpura noted R forearm  Lymph nodes:  Cervical, supraclavicular, and inguinal nodes normal   Neurologic:   Normal strength, sensation and gait; reflexes 2+ and symmetric throughout                 Psych:  Normal mood, affect, hygiene and grooming  *** update bradycardic, skin on face, purpura forearm? Hemorrhoid? Swelling at patella R>L?  ASSESSMENT/PLAN:   CPE ONLY--HAD AWV IN [facility]   Did he get Tdap from pharmacy? Flu, COVID RSV from pharmacy   Insurance will not cover A1c (only once a year). (If pt wants, can do and he will need to sign waiver)  Fasting glucose Consider CBC, ferritin, depending on last blood donation.  Hadn't donated blood for 3 mos before last labs. Lab Results  Component  Value Date   WBC 3.8 03/08/2022   HGB 12.2 (L) 03/08/2022   HCT 38.1 03/08/2022   MCV 86 03/08/2022   PLT 208 03/08/2022     Discussed PSA screening (risks/benefits), recommended at least 30 minutes of aerobic activity at least 5 days/week, weight-bearing exercise 2x/week; proper sunscreen use reviewed; healthy diet and alcohol recommendations (less than or equal to 2 drinks/day) reviewed; regular seatbelt use; changing batteries in smoke detectors. Immunization recommendations discussed-- high dose flu shot.   COVID RSV recommended from pharmacy. Due for TdaP from pharmacy.  Colonoscopy recommendations reviewed--UTD. 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

## 2022-09-12 ENCOUNTER — Ambulatory Visit (INDEPENDENT_AMBULATORY_CARE_PROVIDER_SITE_OTHER): Payer: Medicare Other | Admitting: Family Medicine

## 2022-09-12 ENCOUNTER — Encounter: Payer: Self-pay | Admitting: Family Medicine

## 2022-09-12 VITALS — BP 120/76 | HR 60 | Ht 72.5 in | Wt 217.6 lb

## 2022-09-12 DIAGNOSIS — M7041 Prepatellar bursitis, right knee: Secondary | ICD-10-CM

## 2022-09-12 DIAGNOSIS — Z0000 Encounter for general adult medical examination without abnormal findings: Secondary | ICD-10-CM | POA: Diagnosis not present

## 2022-09-12 DIAGNOSIS — Z6829 Body mass index (BMI) 29.0-29.9, adult: Secondary | ICD-10-CM | POA: Diagnosis not present

## 2022-09-12 DIAGNOSIS — R7303 Prediabetes: Secondary | ICD-10-CM | POA: Diagnosis not present

## 2022-09-12 DIAGNOSIS — Z8279 Family history of other congenital malformations, deformations and chromosomal abnormalities: Secondary | ICD-10-CM

## 2022-09-12 DIAGNOSIS — D509 Iron deficiency anemia, unspecified: Secondary | ICD-10-CM | POA: Diagnosis not present

## 2022-09-12 DIAGNOSIS — I10 Essential (primary) hypertension: Secondary | ICD-10-CM | POA: Diagnosis not present

## 2022-09-12 DIAGNOSIS — L720 Epidermal cyst: Secondary | ICD-10-CM

## 2022-09-12 DIAGNOSIS — Z23 Encounter for immunization: Secondary | ICD-10-CM | POA: Diagnosis not present

## 2022-09-12 LAB — POCT CBG (FASTING - GLUCOSE)-MANUAL ENTRY: Glucose Fasting, POC: 98 mg/dL (ref 70–99)

## 2022-09-13 ENCOUNTER — Encounter (INDEPENDENT_AMBULATORY_CARE_PROVIDER_SITE_OTHER): Payer: Medicare Other | Admitting: Ophthalmology

## 2022-09-13 DIAGNOSIS — H43813 Vitreous degeneration, bilateral: Secondary | ICD-10-CM

## 2022-09-13 DIAGNOSIS — H35033 Hypertensive retinopathy, bilateral: Secondary | ICD-10-CM

## 2022-09-13 DIAGNOSIS — H3553 Other dystrophies primarily involving the sensory retina: Secondary | ICD-10-CM

## 2022-09-13 DIAGNOSIS — H353132 Nonexudative age-related macular degeneration, bilateral, intermediate dry stage: Secondary | ICD-10-CM | POA: Diagnosis not present

## 2022-09-13 DIAGNOSIS — I10 Essential (primary) hypertension: Secondary | ICD-10-CM | POA: Diagnosis not present

## 2022-09-13 LAB — CBC WITH DIFFERENTIAL/PLATELET
Basophils Absolute: 0.1 10*3/uL (ref 0.0–0.2)
Basos: 1 %
EOS (ABSOLUTE): 0.5 10*3/uL — ABNORMAL HIGH (ref 0.0–0.4)
Eos: 9 %
Hematocrit: 42.5 % (ref 37.5–51.0)
Hemoglobin: 14.1 g/dL (ref 13.0–17.7)
Immature Grans (Abs): 0 10*3/uL (ref 0.0–0.1)
Immature Granulocytes: 0 %
Lymphocytes Absolute: 1.3 10*3/uL (ref 0.7–3.1)
Lymphs: 22 %
MCH: 29.7 pg (ref 26.6–33.0)
MCHC: 33.2 g/dL (ref 31.5–35.7)
MCV: 90 fL (ref 79–97)
Monocytes Absolute: 0.6 10*3/uL (ref 0.1–0.9)
Monocytes: 10 %
Neutrophils Absolute: 3.4 10*3/uL (ref 1.4–7.0)
Neutrophils: 58 %
Platelets: 206 10*3/uL (ref 150–450)
RBC: 4.74 x10E6/uL (ref 4.14–5.80)
RDW: 13.6 % (ref 11.6–15.4)
WBC: 5.9 10*3/uL (ref 3.4–10.8)

## 2022-09-13 LAB — FERRITIN: Ferritin: 29 ng/mL — ABNORMAL LOW (ref 30–400)

## 2022-09-17 ENCOUNTER — Other Ambulatory Visit: Payer: Self-pay | Admitting: Family Medicine

## 2022-09-17 ENCOUNTER — Other Ambulatory Visit: Payer: Medicare Other

## 2022-09-17 DIAGNOSIS — N529 Male erectile dysfunction, unspecified: Secondary | ICD-10-CM

## 2022-09-17 NOTE — Telephone Encounter (Signed)
Is this okay to refill? 

## 2022-09-18 ENCOUNTER — Ambulatory Visit
Admission: RE | Admit: 2022-09-18 | Discharge: 2022-09-18 | Disposition: A | Discharge status: Home / Self Care | Payer: Medicare Other | Source: Ambulatory Visit | Attending: Family Medicine | Admitting: Family Medicine

## 2022-09-18 DIAGNOSIS — Z841 Family history of disorders of kidney and ureter: Secondary | ICD-10-CM | POA: Diagnosis not present

## 2022-09-18 DIAGNOSIS — Z8279 Family history of other congenital malformations, deformations and chromosomal abnormalities: Secondary | ICD-10-CM

## 2022-11-23 ENCOUNTER — Encounter: Payer: Self-pay | Admitting: Family Medicine

## 2022-11-23 DIAGNOSIS — M7989 Other specified soft tissue disorders: Secondary | ICD-10-CM

## 2022-11-29 ENCOUNTER — Other Ambulatory Visit: Payer: Self-pay | Admitting: Family Medicine

## 2022-11-29 DIAGNOSIS — I10 Essential (primary) hypertension: Secondary | ICD-10-CM

## 2022-12-27 ENCOUNTER — Ambulatory Visit (INDEPENDENT_AMBULATORY_CARE_PROVIDER_SITE_OTHER): Payer: Medicare Other | Admitting: Plastic Surgery

## 2022-12-27 ENCOUNTER — Encounter: Payer: Self-pay | Admitting: Plastic Surgery

## 2022-12-27 VITALS — BP 114/70 | HR 59 | Ht 74.0 in | Wt 229.2 lb

## 2022-12-27 DIAGNOSIS — D489 Neoplasm of uncertain behavior, unspecified: Secondary | ICD-10-CM

## 2022-12-27 DIAGNOSIS — R220 Localized swelling, mass and lump, head: Secondary | ICD-10-CM

## 2022-12-27 NOTE — Progress Notes (Signed)
Referring Provider Rita Ohara, MD [address] [place],  [place] [address]   CC:  Chief Complaint  Patient presents with   Consult      Dillon Caldwell is an 72 y.o. male.  HPI: Dillon Caldwell is a 72 year old male who presents today for evaluation of a soft tissue mass on the left portion of his forehead.  He states the mass has been there since 2016 but seems to be increasing slightly in size.  He would like to have it excised he denies significant medical problems and denies use of any type of anticoagulant including aspirin  No Known Allergies  Outpatient Encounter Medications as of 12/27/2022  Medication Sig Note   fluticasone (FLONASE) 50 MCG/ACT nasal spray Place 1 spray into both nostrils daily.    losartan (COZAAR) 25 MG tablet TAKE 1 TABLET BY MOUTH DAILY    NON FORMULARY Take 1 each by mouth at bedtime. 09/12/2022: Hemp gummy   Probiotic Product (PROBIOTIC DAILY PO) Take 1 capsule by mouth daily.    tadalafil (CIALIS) 20 MG tablet TAKE 1/2 TO 1 TABLET BY MOUTH EVERY OTHER DAY AS NEEDED FOR ERECTILE DYSFUNCTION    No facility-administered encounter medications on file as of 12/27/2022.     Past Medical History:  Diagnosis Date   BPH (benign prostatic hyperplasia)    Erectile dysfunction    previously took Cialis   Essential hypertension, benign    diagnosed age 55; off meds 08/2012   Hypogonadism male    testosterone level 186 on 09/2010   Impaired fasting glucose    A1c 6.1 and fglu 127 01/2011   Macular degeneration, dry 2011   followed by retinal specialist (dry)   Tachycardia    likely h/o SVT, treated with ablation     Past Surgical History:  Procedure Laterality Date   ABLATION OF DYSRHYTHMIC FOCUS  2006   ?PSVT by history   ANKLE SURGERY     both ankles with bone spurs (related to [place])   ARTHROSCOPIC REPAIR ACL Left 2001   L knee   FOOT SURGERY Bilateral    bone spurs removed from both ankles in the 70's (different years, similar surgery  bilaterally)   KNEE ARTHROSCOPY Left 08/02/2021   Dr. Elsie Saas   ROTATOR CUFF REPAIR Right 04/08/2015   ROTATOR CUFF REPAIR Left 01/2018   supraspinatous (repaired in [place])   TONSILLECTOMY  child   TRIGGER FINGER RELEASE Left 03/22/2022   Dr. Mardelle Matte   VASECTOMY      Family History  Problem Relation Age of Onset   Diabetes Mother    Heart disease Mother        angioplasty in her 21's   Pulmonary fibrosis Mother        possibly misdiagnosed   Hypertension Mother    Macular degeneration Mother    Thyroid disease Mother        had thyroidectomy (unclear diagnosis for why)   Single kidney Father    Stroke Brother 71   Brain cancer Brother 57       glioblastoma   Cancer Maternal Grandmother 76       pancreatic cancer   Pancreatic cancer Maternal Grandmother    Single kidney Paternal Grandfather    Crohn's disease Son        dx'd at age 55 months   Macular degeneration Maternal Aunt    Diabetes Maternal Aunt    Macular degeneration Maternal Aunt    Diabetes Maternal Aunt  Macular degeneration Maternal Uncle    Diabetes Maternal Uncle    Cancer Maternal Uncle        prostate cancer   Single kidney Grandson     Social History   Social History Narrative   Lives with wife (moved back from [place] 04/2020, after wife retired).      Son moved to [place], [place], daughter in [place], teaching at [employer] (will be the head of school).  2 grandsons (1 with only 1 kidney)      Updated 08/2022     Review of Systems General: Denies fevers, chills, weight loss CV: Denies chest pain, shortness of breath, palpitations Skin: Slowly growing mass on the left forehead  Physical Exam    12/27/2022    1:46 PM 09/12/2022    1:37 PM 03/08/2022    9:35 AM  Vitals with BMI  Height '6\' 2"'$  6' 0.5" '6\' 1"'$   Weight 229 lbs 3 oz 217 lbs 10 oz 207 lbs 13 oz  BMI 29.41 16.38 45.36  Systolic 468 032 122  Diastolic 70 76 82  Pulse 59 60 56    General:  No acute distress,   Alert and oriented, Non-Toxic, Normal speech and affect Integument: The patient has a soft mass approximately 3 cm x 3 cm on the left forehead in the temporal region.  The mass is mobile does not appear to be fixed to any underlying structures.  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  Will plan to excise it under local in the office.  Discussed this plan with the patient he has no questions.  Will schedule for an office procedure.  Camillia Herter 12/27/2022, 2:06 PM

## 2023-01-24 ENCOUNTER — Ambulatory Visit: Payer: Medicare Other | Admitting: Plastic Surgery

## 2023-01-24 ENCOUNTER — Encounter: Payer: Self-pay | Admitting: Plastic Surgery

## 2023-01-24 VITALS — BP 105/71 | HR 53

## 2023-01-24 DIAGNOSIS — D489 Neoplasm of uncertain behavior, unspecified: Secondary | ICD-10-CM

## 2023-01-24 DIAGNOSIS — D170 Benign lipomatous neoplasm of skin and subcutaneous tissue of head, face and neck: Secondary | ICD-10-CM

## 2023-01-24 NOTE — Progress Notes (Signed)
Procedure Note  Preoperative Dx: Soft tissue mass left forehead  Postoperative Dx: Same  Procedure: Removal of submuscular mass with complex closure  Anesthesia: Lidocaine 1% with 1:100,000 epinephrine and 0.25% Sensorcaine   Indication for Procedure: Removal for pathologic diagnosis  Description of Procedure: Risks and complications were explained to the patient including bleeding and recurrence of the mass.  Consent was confirmed and the patient understands the risks and benefits.  The potential complications and alternatives were explained and the patient consents.  The patient expressed understanding the option of not having the procedure and the risks of a scar.  Time out was called and all information was confirmed to be correct.    The area was prepped and drapped.  Local anesthetic was injected in the subcutaneous tissues.  After waiting for the local to take affect the transverse incision was made over the mass and dissection carried out down through the subcutaneous tissues.  The mass was noted to be underneath the lateral edge of the frontalis muscle.  The muscle was divided sharply and the mass which was noted to be adherent to the surrounding tissue was excised sharply..  After obtaining hemostasis, the surgical wound was closed in layers.  4-0 Monocryl was used to repair the muscle.  4-0 Monocryl was also used to reapproximate the dermal edges.  The skin was then closed with interrupted 5-0 Prolene sutures.  The surgical wound measured approximately 1.5 cm.  A dressing was applied.  The patient was given instructions on how to care for the area and a follow up appointment.  Dillon Caldwell tolerated the procedure well and there were no complications. The specimen was sent to pathology.

## 2023-01-30 ENCOUNTER — Ambulatory Visit (INDEPENDENT_AMBULATORY_CARE_PROVIDER_SITE_OTHER): Payer: Medicare Other | Admitting: Plastic Surgery

## 2023-01-30 ENCOUNTER — Encounter: Payer: Self-pay | Admitting: Plastic Surgery

## 2023-01-30 VITALS — BP 130/81 | HR 65

## 2023-01-30 DIAGNOSIS — D170 Benign lipomatous neoplasm of skin and subcutaneous tissue of head, face and neck: Secondary | ICD-10-CM

## 2023-01-30 NOTE — Progress Notes (Signed)
Dillon Caldwell returns today 1 week after removal of a mass from his forehead for suture removal.  He is doing well.  He had some swelling immediately after surgery which he treated with a compression bandage.  He has had some minimal ecchymoses but denies any pain fevers or chills.  On examination the incision is well-healed.  There is no mass palpable under the incision.  Pathology returned as a fibrolipoma.  Sutures are removed today without difficulty.  The patient was instructed on scar care.  He may return as needed.

## 2023-03-10 NOTE — Progress Notes (Unsigned)
No chief complaint on file.   Dillon Caldwell is a 72 y.o. male who presents for annual wellness visit and follow-up on chronic medical conditions.  He had his annual physical exam in October.  He had the soft tissue mass removed from his forehead in late 12/2022 by Dr. Weyman Croon.  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  Hypertension:  He is compliant with taking losartan 25mg  daily. His BP's are running  His monitor was verified as accurate in 02/2022 Denies headaches, dizziness, chest pain, side effects. No edema.  BP Readings from Last 3 Encounters:  01/30/23 130/81  01/24/23 105/71  12/27/22 114/70      He lost weight with Noom (logging food, 10K steps, daily weight). He had gained weight related to trip to [place] last year, but overall had been maintaining his weight--no longer logging food, but continuing daily weights and steps.   Wt Readings from Last 3 Encounters:  12/27/22 229 lb 3.2 oz (104 kg)  09/12/22 217 lb 9.6 oz (98.7 kg)  03/08/22 207 lb 12.8 oz (94.3 kg)     H/o GERD--No longer having issues with belching and heartburn. No longer needs Prilosec.  In the past has seen ENT (Dr. Jenne Pane) for globus sensation; sometimes a pill would get hung up in throat--this is very infrequent, and relieved by drinking water. At his last visit he reported an episode where he had coughed up a chunk of banana about 45 minutes after eating it. Overall not having significant issues, just occasionally something getting hung up, not often.   Insomnia:  He reports that sleeping is getting better, getting 7 hours/night. He continues to take Hemp/CBD gummies at bedtime. He no longer takes Tylenol PM.   At last visit he reported he tapered back his running, and doesn't have as much knee pain at night.   ED: He is taking cialis 20mg  every other day with good results.   Dyslipidemia--  He reports always having low HDL, even when very active, running marathons. He reports his lipids  have been better ever since doing the long hiking. He tries to follow a lowfat, low cholesterol diet and gets regular exercise. Last lipids were good, due for recheck. Lab Results  Component Value Date   CHOL 159 03/08/2022   HDL 49 03/08/2022   LDLCALC 97 03/08/2022   TRIG 69 03/08/2022   CHOLHDL 3.2 03/08/2022   (He previously took an OTC no-flush niacin once daily, and fish oil 1000mg  once daily. He stopped taking these a few years ago when logistically it became difficult [place].)   Prediabetes--A1c was 6.1% in 02/2022. At that time he had been having ice cream sandwiches daily, no other sweets.  He continues to eat the ice cream sandwiches daily. He eats a lot of salads.  He doesn't eat bread, rice, only small portions of pasta. He remains physically active.    Immunization History  Administered Date(s) Administered   COVID-19, mRNA, vaccine(Comirnaty)12 years and older 09/12/2022   Influenza Split 09/08/2012   Influenza, High Dose Seasonal PF 12/01/2015, 07/27/2020, 08/21/2022   Influenza,inj,Quad PF,6+ Mos 09/24/2013, 08/30/2014   Moderna Sars-Covid-2 Vaccination 05/02/2021   PFIZER(Purple Top)SARS-COV-2 Vaccination 02/04/2020, 02/25/2020, 09/25/2020   Pfizer Covid-19 Vaccine Bivalent Booster 22yrs & up 09/06/2021   Pneumococcal Conjugate-13 06/27/2016   Pneumococcal Polysaccharide-23 01/12/2021   Tdap 01/02/2012, 08/15/2022   Zoster Recombinat (Shingrix) 01/27/2021, 04/08/2021   Zoster, Live 12/03/2012   Last colonoscopy: 07/2018, tubular adenoma, diverticulosis (in [place])  Last PSA: Lab Results  Component Value Date   PSA1 2.0 03/08/2022   PSA1 1.7 01/12/2021   PSA 1.69 12/22/2019   PSA 1.45 06/27/2016   PSA 1.88 12/30/2014   Dentist: Dr. Helmut Muster two times/year Ophtho: yearly Exercise:   Hiking UPDATE Walking or running 6-7 days/week for 2 hours at a time. Occasional [place] bike; continues to do Pilates once a week.  He is using some resistance  bands.    Patient Care Team: Joselyn Arrow, MD as PCP - General (Family Medicine) Dentist: Dr. Vivianne Master: [facility] [address], and Dr. Ashley Royalty (retinal specialist) ENT: Dr. Jenne Pane Ortho: Dr. Thurston Hole, Dr. Dion Saucier (for trigger finger) Plastic Surgery: Dr. Weyman Croon  Depression Screening: Flowsheet Row Office Visit from 09/12/2022 in [facility]  PHQ-2 Total Score 0         Falls screen:     09/12/2022    1:38 PM 03/08/2022    9:37 AM 07/06/2021    1:35 PM 01/12/2021    9:46 AM 06/27/2016    9:04 AM  Fall Risk   Falls in the past year? 0 1 0 1 Yes  Number falls in past yr: 0 1 0 1 2 or more  Comment  running on both, tripped on curb. 3/23 and 11/22     Injury with Fall? 0 0 0 1 No  Comment  bloody nose  cracked a rib on right side   Risk for fall due to : No Fall Risks History of fall(s) No Fall Risks    Follow up Falls evaluation completed Falls evaluation completed Falls evaluation completed       Functional Status Survey:          End of Life Discussion:  Patient has a living will and medical power of attorney. We do not have copies  PMH, PSH, SH and FH were reviewed and updated Brother with glioblastoma--moved to hospice end of 11-21-2022.  ***died??    ROS: The patient denies anorexia, fever, headaches, ear pain, hoarseness, chest pain, dizziness, syncope, dyspnea on exertion, cough, swelling, nausea, vomiting, abdominal pain, melena, hematochezia, hematuria, nocturia, hesitance or weakened stream, incontinence, dysuria, genital lesions, joint pains, numbness, tingling, weakness, tremor, suspicious skin lesions, depression, anxiety, abnormal bleeding/bruising, or enlarged lymph nodes. +insomnia, controlled with OTC, per HPI +hearing loss--wears hearing aids. Tinnitus bilaterally (chronic)    PHYSICAL EXAM:  There were no vitals taken for this visit.  Wt Readings from Last 3 Encounters:  12/27/22 229 lb 3.2 oz (104 kg)  09/12/22 217 lb  9.6 oz (98.7 kg)  03/08/22 207 lb 12.8 oz (94.3 kg)   Pleasant male in no distress. Hard of hearing, even with his current hearing aids. HEENT: conjunctiva and sclera are clear, EOMI.   Heart: bradycardic, regular rhythm Lungs: clear bilaterally Extremities: no edema Neuro: alert and oriented, cranial nerves grossly intact. Normal gait. Skin: purpura noted on R forearm.  ***UPDATE_-WHSS at L forehead? Brady or reg rate? Purpura??   ASSESSMENT/PLAN:  Did he get RSV from pharmacy? COVID booster?  If hasn't had recently, offer today   Coming in morning for labs to be drawn and held. Needs A1c--you can ask if he wants POC for results today, vs doing with labs and getting results tomorrow  When did he last donate blood?  CBC, c-met, lipids, PSA, ferritin A1c (POC or with labs, for pt to decide)  REFER TO GI--due for colonoscopy 07/2023, had tubular adenoma in [place] in 07/2018. RF losartan  Discussed PSA  screening (risks/benefits), recommended at least 30 minutes of aerobic activity at least 5 days/week, weight-bearing exercise 2x/week; proper sunscreen use reviewed; healthy diet and alcohol recommendations (less than or equal to 2 drinks/day) reviewed; regular seatbelt use; changing batteries in smoke detectors. Immunization recommendations discussed--continue yearly high dose flu shots.  RSV vaccine recommended in the Fall, to get from the pharmacy. COVID booster Colonoscopy recommendations reviewed--will be due 07/2023.  MOST form reviewed, updated. Doesn't want prolonged measures.  Requested Living Will, Healthcare POA for scanning into chart.  F/u 6 months for CPE, sooner prn  Medicare Attestation I have personally reviewed: The patient's medical and social history Their use of alcohol, tobacco or illicit drugs Their current medications and supplements The patient's functional ability including ADLs,fall risks, home safety risks, cognitive, and hearing and visual  impairment Diet and physical activities Evidence for depression or mood disorders  The patient's weight, height, and BMI have been recorded in the chart.  I have made referrals, counseling, and provided education to the patient based on review of the above and I have provided the patient with a written personalized care plan for preventive services.

## 2023-03-10 NOTE — Patient Instructions (Incomplete)
  Dillon Caldwell , Thank you for taking time to come for your Medicare Wellness Visit. I appreciate your ongoing commitment to your health goals. Please review the following plan we discussed and let me know if I can assist you in the future.   This is a list of the screening recommended for you and due dates:  Health Maintenance  Topic Date Due   COVID-19 Vaccine (7 - 2023-24 season) 11/07/2022   Medicare Annual Wellness Visit  03/09/2023   Flu Shot  06/27/2023   Colon Cancer Screening  08/21/2023   DTaP/Tdap/Td vaccine (3 - Td or Tdap) 08/15/2032   Pneumonia Vaccine  Completed   Hepatitis C Screening: USPSTF Recommendation to screen - Ages 37-79 yo.  Completed   Zoster (Shingles) Vaccine  Completed   HPV Vaccine  Aged Out   Please bring [place] copies of your Living Will and Healthcare Power of Attorney so that it can be scanned into your medical chart.  Pay attention to the news--you may not need another RSV vaccine in the Fall (the one you got may last for 2 RSV seasons).  You will be due for another colonoscopy in September. We will refer you to GI (to the same practice where Darl Pikes went).  Try and get in a regular routine of taking your blood pressure medication (even while hiking). Check your blood pressure once a month, so that you can start monitoring more closely if you see it go up.  Tylenol PM would be a little better than Advil PM--so that it doesn't hurt your kidneys or stomach, especially if you aren't taking it with food.  Try and skip naps, but if needed, limit it to 20-30 minutes.  Today's lipids will not include what you stated you are planning ot eat, which is 14 egg yolks/week.  I would prefer you to keep the yolks much less (4/week if possible), or your LDL may increase a lot.  COVID booster--you can get this from the pharmacy vs schedule a nurse visit here.

## 2023-03-11 ENCOUNTER — Ambulatory Visit (INDEPENDENT_AMBULATORY_CARE_PROVIDER_SITE_OTHER): Payer: Medicare Other | Admitting: Family Medicine

## 2023-03-11 ENCOUNTER — Encounter: Payer: Self-pay | Admitting: Family Medicine

## 2023-03-11 VITALS — BP 124/74 | HR 60 | Ht 72.5 in | Wt 211.4 lb

## 2023-03-11 DIAGNOSIS — E785 Hyperlipidemia, unspecified: Secondary | ICD-10-CM

## 2023-03-11 DIAGNOSIS — D509 Iron deficiency anemia, unspecified: Secondary | ICD-10-CM | POA: Diagnosis not present

## 2023-03-11 DIAGNOSIS — Z1211 Encounter for screening for malignant neoplasm of colon: Secondary | ICD-10-CM

## 2023-03-11 DIAGNOSIS — R7303 Prediabetes: Secondary | ICD-10-CM | POA: Diagnosis not present

## 2023-03-11 DIAGNOSIS — Z0000 Encounter for general adult medical examination without abnormal findings: Secondary | ICD-10-CM | POA: Diagnosis not present

## 2023-03-11 DIAGNOSIS — I10 Essential (primary) hypertension: Secondary | ICD-10-CM

## 2023-03-11 DIAGNOSIS — D692 Other nonthrombocytopenic purpura: Secondary | ICD-10-CM | POA: Diagnosis not present

## 2023-03-11 DIAGNOSIS — H9193 Unspecified hearing loss, bilateral: Secondary | ICD-10-CM

## 2023-03-11 DIAGNOSIS — Z125 Encounter for screening for malignant neoplasm of prostate: Secondary | ICD-10-CM | POA: Diagnosis not present

## 2023-03-11 DIAGNOSIS — Z86010 Personal history of colonic polyps: Secondary | ICD-10-CM

## 2023-03-12 ENCOUNTER — Encounter: Payer: Self-pay | Admitting: Family Medicine

## 2023-03-12 ENCOUNTER — Telehealth: Payer: Self-pay | Admitting: Gastroenterology

## 2023-03-12 LAB — COMPREHENSIVE METABOLIC PANEL
ALT: 20 IU/L (ref 0–44)
AST: 24 IU/L (ref 0–40)
Albumin/Globulin Ratio: 2 (ref 1.2–2.2)
Albumin: 4.1 g/dL (ref 3.8–4.8)
Alkaline Phosphatase: 95 IU/L (ref 44–121)
BUN/Creatinine Ratio: 17 (ref 10–24)
BUN: 15 mg/dL (ref 8–27)
Bilirubin Total: 0.2 mg/dL (ref 0.0–1.2)
CO2: 21 mmol/L (ref 20–29)
Calcium: 9.1 mg/dL (ref 8.6–10.2)
Chloride: 106 mmol/L (ref 96–106)
Creatinine, Ser: 0.9 mg/dL (ref 0.76–1.27)
Globulin, Total: 2.1 g/dL (ref 1.5–4.5)
Glucose: 118 mg/dL — ABNORMAL HIGH (ref 70–99)
Potassium: 4.7 mmol/L (ref 3.5–5.2)
Sodium: 141 mmol/L (ref 134–144)
Total Protein: 6.2 g/dL (ref 6.0–8.5)
eGFR: 90 mL/min/{1.73_m2} (ref >59)

## 2023-03-12 LAB — CBC WITH DIFFERENTIAL/PLATELET
Basophils Absolute: 0 10*3/uL (ref 0.0–0.2)
Basos: 1 %
EOS (ABSOLUTE): 0.1 10*3/uL (ref 0.0–0.4)
Eos: 3 %
Hematocrit: 41.5 % (ref 37.5–51.0)
Hemoglobin: 13.4 g/dL (ref 13.0–17.7)
Immature Grans (Abs): 0 10*3/uL (ref 0.0–0.1)
Immature Granulocytes: 0 %
Lymphocytes Absolute: 1 10*3/uL (ref 0.7–3.1)
Lymphs: 26 %
MCH: 29.8 pg (ref 26.6–33.0)
MCHC: 32.3 g/dL (ref 31.5–35.7)
MCV: 92 fL (ref 79–97)
Monocytes Absolute: 0.4 10*3/uL (ref 0.1–0.9)
Monocytes: 11 %
Neutrophils Absolute: 2.2 10*3/uL (ref 1.4–7.0)
Neutrophils: 59 %
Platelets: 221 10*3/uL (ref 150–450)
RBC: 4.5 x10E6/uL (ref 4.14–5.80)
RDW: 12.8 % (ref 11.6–15.4)
WBC: 3.8 10*3/uL (ref 3.4–10.8)

## 2023-03-12 LAB — HEMOGLOBIN A1C
Est. average glucose Bld gHb Est-mCnc: 120 mg/dL
Hgb A1c MFr Bld: 5.8 % — ABNORMAL HIGH (ref 4.8–5.6)

## 2023-03-12 LAB — LIPID PANEL
Chol/HDL Ratio: 3.3 ratio (ref 0.0–5.0)
Cholesterol, Total: 144 mg/dL (ref 100–199)
HDL: 44 mg/dL (ref >39)
LDL Chol Calc (NIH): 81 mg/dL (ref 0–99)
Triglycerides: 104 mg/dL (ref 0–149)
VLDL Cholesterol Cal: 19 mg/dL (ref 5–40)

## 2023-03-12 LAB — FERRITIN: Ferritin: 36 ng/mL (ref 30–400)

## 2023-03-12 LAB — PSA: Prostate Specific Ag, Serum: 2.7 ng/mL (ref 0.0–4.0)

## 2023-03-12 NOTE — Telephone Encounter (Signed)
Findings of 2019 colonoscopy Diverticulosis in the sigmoid colon/descending colon 1 2 mm polyp in the mid ascending colon removed with cold biopsy forceps. The examination was otherwise normal on direct and retroflexion views. Patient has history of previous tubular adenomas.  I think is reasonable for this patient to undergo 5-year colonoscopy recall.  After that we can get him back up-to-date with new surveillance guidelines based on what is found.  He can be scheduled for direct colonoscopy but if you would like to be seen in clinic that is okay to. Thanks. GM

## 2023-03-12 NOTE — Telephone Encounter (Signed)
Dr. Meridee Score,  We received a referral for this patient to have a colonoscopy.  Last colon was done in 2019 (records in [place]).  Patient is requesting you because his wife is a patient of yours.   Please review and advise scheduling.  Thanks [employer]

## 2023-03-13 ENCOUNTER — Encounter: Payer: Medicare Other | Admitting: Family Medicine

## 2023-03-25 ENCOUNTER — Other Ambulatory Visit: Payer: Self-pay | Admitting: Family Medicine

## 2023-03-25 DIAGNOSIS — I10 Essential (primary) hypertension: Secondary | ICD-10-CM

## 2023-05-29 DIAGNOSIS — M869 Osteomyelitis, unspecified: Secondary | ICD-10-CM | POA: Diagnosis not present

## 2023-08-20 ENCOUNTER — Ambulatory Visit (INDEPENDENT_AMBULATORY_CARE_PROVIDER_SITE_OTHER): Payer: Medicare Other | Admitting: Medical

## 2023-08-20 ENCOUNTER — Encounter: Payer: Self-pay | Admitting: Medical

## 2023-08-20 VITALS — BP 140/80 | HR 75 | Temp 101.2°F | Ht 70.5 in | Wt 222.6 lb

## 2023-08-20 DIAGNOSIS — R5381 Other malaise: Secondary | ICD-10-CM | POA: Diagnosis not present

## 2023-08-20 DIAGNOSIS — R509 Fever, unspecified: Secondary | ICD-10-CM | POA: Diagnosis not present

## 2023-08-20 LAB — POC COVID19 BINAXNOW: SARS Coronavirus 2 Ag: NEGATIVE

## 2023-08-20 NOTE — Progress Notes (Signed)
Subjective:  Dillon Caldwell is a 72 y.o. male who presents for Chief Complaint  Patient presents with   Fatigue    Micah Flesher out for a walk at 10:45am, walked 4.8 miles. Walking with a friend and it just didn't feel like the usual walk. It was a stuggle. He has a mild HA. Pulse was 74 which is higher than his norm. Took a nap after his walk. When he woke up, went upstairs to get bp machine I was 166/91. Waited a few minutes and took it again.Marland Kitchen..148/80. Took lostartan this am when he woke up as he typically does.      Here for concern, not feeling well.  He is active, walks regularly.  He went for his usual daily walk this morning but felt like "crap."  He came home, rested, checked his blood pressure and it was elevated at 166/91.  He felt feverish this morning but no body aches or chills.  Little bit later in the morning it was still elevated.  He just does not feel great today.  He denies cough, no runny nose, no sneezing, no urinary complaint, no dysuria, no frequency, no gastric complaint, no nausea vomiting diarrhea or upset stomach, no chest pain, no shortness of breath, no edema.  Overall his walk was difficult today compared to his usual normal walk without problem.  No sick contacts.  No numbness, tingling, weakness  No other aggravating or relieving factors.    No other c/o.  Past Medical History:  Diagnosis Date   BPH (benign prostatic hyperplasia)    Erectile dysfunction    previously took Cialis   Essential hypertension, benign    diagnosed age 64; off meds 08/2012   Hypogonadism male    testosterone level 186 on 09/2010   Impaired fasting glucose    A1c 6.1 and fglu 127 01/2011   Macular degeneration, dry 2011   followed by retinal specialist (dry)   Tachycardia    likely h/o SVT, treated with ablation    Current Outpatient Medications on File Prior to Visit  Medication Sig Dispense Refill   losartan (COZAAR) 25 MG tablet TAKE 1 TABLET BY MOUTH DAILY 90 tablet  1   fluticasone (FLONASE) 50 MCG/ACT nasal spray Place 1 spray into both nostrils daily. (Patient not taking: Reported on 03/11/2023)     NON FORMULARY Take 1 each by mouth at bedtime. (Patient not taking: Reported on 08/20/2023)     tadalafil (CIALIS) 20 MG tablet TAKE 1/2 TO 1 TABLET BY MOUTH EVERY OTHER DAY AS NEEDED FOR ERECTILE DYSFUNCTION (Patient not taking: Reported on 03/11/2023) 15 tablet 11   No current facility-administered medications on file prior to visit.     The following portions of the patient's history were reviewed and updated as appropriate: allergies, current medications, past family history, past medical history, past social history, past surgical history and problem list.  ROS Otherwise as in subjective above    Objective: BP (!) 140/80   Pulse 75   Temp (!) 101.2 F (38.4 C) (Tympanic)   Ht 5' 10.5" (1.791 m)   Wt 222 lb 9.6 oz (101 kg)   SpO2 97%   BMI 31.49 kg/m   BP Readings from Last 3 Encounters:  08/20/23 (!) 140/80  03/11/23 124/74  01/30/23 130/81   Wt Readings from Last 3 Encounters:  08/20/23 222 lb 9.6 oz (101 kg)  03/11/23 211 lb 6.4 oz (95.9 kg)  12/27/22 229 lb 3.2 oz (104 kg)  General appearance: alert, no distress, well developed, well nourished HEENT: normocephalic, sclerae anicteric, conjunctiva pink and moist, TMs pearly, nares patent, no discharge or erythema, pharynx normal Oral cavity: MMM, no lesions Neck: supple, no lymphadenopathy, no thyromegaly, no masses, no JVD or bruits Heart: RRR, normal S1, S2, no murmurs Lungs: CTA bilaterally, no wheezes, rhonchi, or rales Abdomen: +bs, soft, non tender, non distended, no masses, no hepatomegaly, no splenomegaly Pulses: 2+ radial pulses, 2+ pedal pulses, normal cap refill Ext: no edema Neuro: CN2-12 intact, nonfocal exam   EKG No acute changes    Assessment: Encounter Diagnoses  Name Primary?   Malaise Yes   Fever, unspecified fever cause      Plan: Discussed  symptoms, concerns,  no new acute EKG changes.  Discussed that he could be on early stage of illness without other symptom  Advised rest, hydration, tylenol for fever and not feeling well.  Advised as he possible gets new symptom in the next few days to call or my chart message, but discussed possibility of new URI symptoms, possibly rechecking covid swab in next few days.  However if new skin changes, new abdominal symptoms, or new GU/Gi symptoms to recheck.  Hattie was seen today for fatigue.  Diagnoses and all orders for this visit:  Malaise -     POC COVID-19 -     EKG 12-Lead  Fever, unspecified fever cause -     POC COVID-19    Follow up: prn

## 2023-08-22 ENCOUNTER — Encounter: Payer: Self-pay | Admitting: Family Medicine

## 2023-09-10 ENCOUNTER — Encounter (INDEPENDENT_AMBULATORY_CARE_PROVIDER_SITE_OTHER): Payer: Medicare Other | Admitting: Ophthalmology

## 2023-09-10 DIAGNOSIS — H3553 Other dystrophies primarily involving the sensory retina: Secondary | ICD-10-CM

## 2023-09-10 DIAGNOSIS — H353132 Nonexudative age-related macular degeneration, bilateral, intermediate dry stage: Secondary | ICD-10-CM | POA: Diagnosis not present

## 2023-09-10 DIAGNOSIS — H35033 Hypertensive retinopathy, bilateral: Secondary | ICD-10-CM | POA: Diagnosis not present

## 2023-09-10 DIAGNOSIS — I10 Essential (primary) hypertension: Secondary | ICD-10-CM | POA: Diagnosis not present

## 2023-09-10 DIAGNOSIS — H43813 Vitreous degeneration, bilateral: Secondary | ICD-10-CM

## 2023-10-03 ENCOUNTER — Other Ambulatory Visit: Payer: Self-pay | Admitting: Family Medicine

## 2023-10-03 DIAGNOSIS — N529 Male erectile dysfunction, unspecified: Secondary | ICD-10-CM

## 2023-10-07 ENCOUNTER — Ambulatory Visit: Payer: Medicare Other | Admitting: Family Medicine

## 2023-10-27 NOTE — Progress Notes (Unsigned)
No chief complaint on file.   Dillon Caldwell is a 72 y.o. male who presents annual exam and follow-up on chronic medical conditions.  He had AWV in 02/2023.  Hypertension:  He is compliant with taking losartan 25mg  daily. He denies side effects. BP's are running  His monitor was verified as accurate in 02/2022 Denies headaches, dizziness, chest pain, edema.   BP Readings from Last 3 Encounters:  08/20/23 (!) 140/80  03/11/23 124/74  01/30/23 130/81    He lost weight in the past using Noom (logging food, 10K steps, daily weight).  At his last visit in April he reported he no longer logs food, but still monitors his steps and daily weights.   Wt Readings from Last 3 Encounters:  08/20/23 222 lb 9.6 oz (101 kg)  03/11/23 211 lb 6.4 oz (95.9 kg)  12/27/22 229 lb 3.2 oz (104 kg)     Insomnia:  He sometimes has trouble shutting his mind down to get to sleep.  In general, he sleeps 7 hours/night with an occasional nap. He continues to take CBD/Hemp gummies at bedtime, about 4-5x/week, daily when hiking.  He takes Advil PM just occasionally,  2-3x/month.   ED: He is taking cialis 20mg  every other day with good results.   Dyslipidemia--  He reports always having low HDL, even when very active, running marathons. He reports his lipids have been better ever since doing the long hiking. He tries to follow a lowfat, low cholesterol diet and gets regular exercise. Last lipids were good.   He denies any change to his diet, wife is mostly vegetarian.  When last checked (April), he had eaten cold soaked oatmeal while hiking for a few weeks prior to that check.  Since then, he is eating more eggs ??? (Last visit stated:  He plans to eat omelettes regularly, 3 egg whites and 2 yolks, and some cheese--since hiking, feels he needs to increase his protein.) ***   Lab Results  Component Value Date   CHOL 144 03/11/2023   HDL 44 03/11/2023   LDLCALC 81 03/11/2023   TRIG 104 03/11/2023   CHOLHDL  3.3 03/11/2023     Prediabetes--A1c was 6.1% in 02/2022, when having ice cream sandwiches daily, no other sweets ([place] 200 cal). A1c was 5.8% on last check in 02/2023.  At that time, he had been hiking x 3 weeks, not having ice cream (but having almond M&M's and other foods he doesn't typically eat). He continues to eat a lot of salads, and limits bread, rice, pasta. He remains physically active   Lab Results  Component Value Date   HGBA1C 5.8 (H) 03/11/2023     Immunization History  Administered Date(s) Administered   Fluad Quad(high Dose 65+) 07/30/2023   Influenza Split 09/08/2012   Influenza, High Dose Seasonal PF 12/01/2015, 07/27/2020, 08/21/2022   Influenza,inj,Quad PF,6+ Mos 09/24/2013, 08/30/2014   Moderna Sars-Covid-2 Vaccination 05/02/2021   PFIZER(Purple Top)SARS-COV-2 Vaccination 02/04/2020, 02/25/2020, 09/25/2020   Pfizer Covid-19 Vaccine Bivalent Booster 30yrs & up 09/06/2021   Pfizer(Comirnaty)Fall Seasonal Vaccine 12 years and older 09/12/2022, 07/30/2023   Pneumococcal Conjugate-13 06/27/2016   Pneumococcal Polysaccharide-23 01/12/2021   Respiratory Syncytial Virus Vaccine,Recomb Aduvanted(Arexvy) 12/11/2022   Tdap 01/02/2012, 08/15/2022   Zoster Recombinant(Shingrix) 01/27/2021, 04/08/2021   Zoster, Live 12/03/2012   Last colonoscopy: 07/2018, tubular adenoma, diverticulosis (in [place]) Last PSA: Lab Results  Component Value Date   PSA1 2.7 03/11/2023   PSA1 2.0 03/08/2022   PSA1 1.7 01/12/2021   PSA  1.69 12/22/2019   PSA 1.45 06/27/2016   PSA 1.88 12/30/2014   Dentist: Dr. Helmut Muster two times/year Ophtho: yearly Exercise:  Walking or running 6-7 days/week for 2 hours at a time. Occasional [place] bike; continues to do Pilates once a week.  He is using some resistance bands.    PMH, PSH, SH and FH were reviewed and    ROS: The patient denies anorexia, fever, headaches, ear pain, hoarseness, chest pain, dizziness, syncope, dyspnea on exertion, cough,  swelling, nausea, vomiting, abdominal pain, constipation, diarrhea, melena, hematochezia, hematuria, nocturia, hesitance or weakened stream, incontinence, dysuria, genital lesions, joint pains, numbness, tingling, weakness, tremor, suspicious skin lesions, depression, anxiety, abnormal bleeding/bruising, or enlarged lymph nodes. +insomnia, controlled with OTC, per HPI +hearing loss--wears hearing aids.  Tinnitus bilaterally (chronic)    PHYSICAL EXAM:  There were no vitals taken for this visit.  Wt Readings from Last 3 Encounters:  08/20/23 222 lb 9.6 oz (101 kg)  03/11/23 211 lb 6.4 oz (95.9 kg)  12/27/22 229 lb 3.2 oz (104 kg)   General Appearance:   Alert, cooperative, no distress, appears stated age    Head:   Normocephalic, without obvious abnormality, atraumatic    Eyes:   PERRL, conjunctiva/corneas clear, EOM's intact, fundi   benign    Ears:   Wearing hearing aids bilaterally; TM's and EACs are normal upon their removal   Nose:   No drainage or sinus tenderness   Throat:   Normal mucosa, no lesions  Neck:   Supple, no lymphadenopathy; thyroid: no enlargement/ tenderness/nodules; no carotid bruit or JVD    Back:   Spine nontender, no curvature, ROM normal, no CVA tenderness    Lungs:   Clear to auscultation bilaterally without wheezes, rales or ronchi; respirations unlabored    Chest Wall:   No tenderness or deformity    Heart:   Bradycardic, regular rhythm, S1 and S2 normal, no murmur, rub   or gallop    Breast Exam:   No chest wall tenderness, masses or gynecomastia    Abdomen:   Soft, non-tender, nondistended, normoactive bowel sounds,   no masses, no hepatosplenomegaly. +abdominal obesity   Genitalia:   Normal male external genitalia without lesions. Testicles without masses. No inguinal hernias.    Rectal:   Normal sphincter tone, no masses or tenderness; guaiac negative stool. Prostate smooth, no nodules, not enlarged. Hemorrhoidal tag seen/felt externally, mobile,  nontender.  Extremities:   No clubbing, cyanosis or edema.   Pulses:   2+ and symmetric all extremities    Skin:   Skin color, texture, turgor normal. Purpura noted on forearms bilaterally  Lymph nodes:  Cervical, supraclavicular, and inguinal nodes normal   Neurologic:   Normal strength, sensation and gait; reflexes 2+ and symmetric throughout                 Psych:  Normal mood, affect, hygiene and grooming  ***update if hearing aids not removed. Purpura on forearms??   ASSESSMENT/PLAN:  CPE only, now AWV (done q 6 months apart)  ?cialis refill?  Prevnar-20 Can't do A1c--[employer] only pays once a year. If weight is up, diet is poor, can do and make pt aware of the cost.  Colonoscopy due--preferred provider?  Needs referral (H/o tubular adenoma, last colonoscopy 07/2018 in [place])  Recommended at least 30 minutes of aerobic activity at least 5 days/week, weight-bearing exercise 2x/week; proper sunscreen use reviewed; healthy diet and alcohol recommendations (less than or equal to 2 drinks/day) reviewed; regular seatbelt  use; changing batteries in smoke detectors. Immunization recommendations discussed--continue yearly high dose flu shots.   Prevnar-20  Colonoscopy recommendations reviewed--colonoscopy is due (07/2023 would have been 5 years since last, with tubular adenoma). Refer to GI  F/u 6 months for AWV, sooner prn.

## 2023-10-27 NOTE — Patient Instructions (Incomplete)
  HEALTH MAINTENANCE RECOMMENDATIONS:  It is recommended that you get at least 30 minutes of aerobic exercise at least 5 days/week (for weight loss, you may need as much as 60-90 minutes). This can be any activity that gets your heart rate up. This can be divided in 10-15 minute intervals if needed, but try and build up your endurance at least once a week.  Weight bearing exercise is also recommended twice weekly.  Eat a healthy diet with lots of vegetables, fruits and fiber.  "Colorful" foods have a lot of vitamins (ie green vegetables, tomatoes, red peppers, etc).  Limit sweet tea, regular sodas and alcoholic beverages, all of which has a lot of calories and sugar.  Up to 2 alcoholic drinks daily may be beneficial for men (unless trying to lose weight, watch sugars).  Drink a lot of water.  Sunscreen of at least SPF 30 should be used on all sun-exposed parts of the skin when outside between the hours of 10 am and 4 pm (not just when at beach or pool, but even with exercise, golf, tennis, and yard work!)  Use a sunscreen that says "broad spectrum" so it covers both UVA and UVB rays, and make sure to reapply every 1-2 hours.  Remember to change the batteries in your smoke detectors when changing your clock times in the spring and fall.  Carbon monoxide detectors are recommended for your home.  Use your seat belt every time you are in a car, and please drive safely and not be distracted with cell phones and texting while driving.  You are due for colon cancer screening.  We are referring you to GI to have this done.  Try Tylenol Arthritis at bedtime, along with Voltaren gel to the knee, if needed. Try and avoid use of ibuprofen, and PM medications, if possible.  The tylenol is a  better/safer pain medication to take nightly, if needed. See the sports med guys if pain persists or worsens.  Please ask Darl Pikes about whether or not you have pauses in your breathing at night.  If so, you should have a sleep  study to assess for sleep apnea.  Try and limit egg intake (21/week is a lot!) Oatmeal is a healthier choice.

## 2023-10-28 ENCOUNTER — Ambulatory Visit (INDEPENDENT_AMBULATORY_CARE_PROVIDER_SITE_OTHER): Payer: Medicare Other | Admitting: Family Medicine

## 2023-10-28 ENCOUNTER — Encounter: Payer: Self-pay | Admitting: Family Medicine

## 2023-10-28 VITALS — BP 118/74 | HR 64 | Ht 72.5 in | Wt 223.0 lb

## 2023-10-28 DIAGNOSIS — R7303 Prediabetes: Secondary | ICD-10-CM

## 2023-10-28 DIAGNOSIS — N529 Male erectile dysfunction, unspecified: Secondary | ICD-10-CM

## 2023-10-28 DIAGNOSIS — Z6829 Body mass index (BMI) 29.0-29.9, adult: Secondary | ICD-10-CM | POA: Diagnosis not present

## 2023-10-28 DIAGNOSIS — Z0000 Encounter for general adult medical examination without abnormal findings: Secondary | ICD-10-CM

## 2023-10-28 DIAGNOSIS — I10 Essential (primary) hypertension: Secondary | ICD-10-CM | POA: Diagnosis not present

## 2023-10-28 DIAGNOSIS — Z23 Encounter for immunization: Secondary | ICD-10-CM | POA: Diagnosis not present

## 2023-10-28 DIAGNOSIS — Z1211 Encounter for screening for malignant neoplasm of colon: Secondary | ICD-10-CM

## 2023-10-28 MED ORDER — TADALAFIL 20 MG PO TABS
10.0000 mg | ORAL_TABLET | Freq: Every day | ORAL | 1 refills | Status: DC | PRN
Start: 2023-10-28 — End: 2024-03-02

## 2023-10-28 MED ORDER — LOSARTAN POTASSIUM 25 MG PO TABS: 25.0000 mg | ORAL_TABLET | Freq: Every day | ORAL | 1 refills | Status: DC

## 2023-10-29 ENCOUNTER — Encounter: Payer: Self-pay | Admitting: Family Medicine

## 2023-11-04 ENCOUNTER — Encounter: Payer: Self-pay | Admitting: Family Medicine

## 2023-11-07 DIAGNOSIS — H3553 Other dystrophies primarily involving the sensory retina: Secondary | ICD-10-CM | POA: Diagnosis not present

## 2023-11-08 DIAGNOSIS — M1611 Unilateral primary osteoarthritis, right hip: Secondary | ICD-10-CM | POA: Diagnosis not present

## 2023-11-08 DIAGNOSIS — M25561 Pain in right knee: Secondary | ICD-10-CM | POA: Diagnosis not present

## 2023-12-05 DIAGNOSIS — M1611 Unilateral primary osteoarthritis, right hip: Secondary | ICD-10-CM | POA: Diagnosis not present

## 2023-12-23 ENCOUNTER — Ambulatory Visit (INDEPENDENT_AMBULATORY_CARE_PROVIDER_SITE_OTHER): Payer: Medicare Other

## 2023-12-23 ENCOUNTER — Ambulatory Visit (INDEPENDENT_AMBULATORY_CARE_PROVIDER_SITE_OTHER): Payer: Medicare Other | Admitting: Family Medicine

## 2023-12-23 VITALS — BP 114/80 | HR 61 | Ht 72.5 in | Wt 226.0 lb

## 2023-12-23 DIAGNOSIS — M545 Low back pain, unspecified: Secondary | ICD-10-CM | POA: Diagnosis not present

## 2023-12-23 DIAGNOSIS — M4316 Spondylolisthesis, lumbar region: Secondary | ICD-10-CM | POA: Diagnosis not present

## 2023-12-23 DIAGNOSIS — G8929 Other chronic pain: Secondary | ICD-10-CM

## 2023-12-23 DIAGNOSIS — M4804 Spinal stenosis, thoracic region: Secondary | ICD-10-CM | POA: Diagnosis not present

## 2023-12-23 DIAGNOSIS — M48061 Spinal stenosis, lumbar region without neurogenic claudication: Secondary | ICD-10-CM | POA: Diagnosis not present

## 2023-12-23 MED ORDER — TIZANIDINE HCL 2 MG PO TABS: 2.0000 mg | ORAL_TABLET | Freq: Every day | ORAL | 0 refills | Status: DC

## 2023-12-23 NOTE — Patient Instructions (Addendum)
Thank you for coming in today.   Please get an Xray today before you leave   I've sent a prescription for tizanidine to your pharmacy.   Let me know if you would like a referral to physical therapy

## 2023-12-23 NOTE — Progress Notes (Signed)
   Rubin Payor, PhD, LAT, ATC acting as a scribe for Clementeen Graham, MD.  Dillon Caldwell is a 73 y.o. male who presents to [facility] at [facility] today for LBP ongoing for about a wk. He just woke up with the pain. Pt locates pain to R-side of his low back. He is a retired Education officer, museum. He's hoping to go on a backpacking trip in April.  This backpacking trip is planned on a 3-week multiple mile backpacking adventure through the [facility].  Radiating pain: no LE numbness/tingling: no LE weakness: no Aggravates: putting on shoes/socks, picking objects up from the floor  Treatments tried: icy hot patches, ice, heat, rest, pilates,   He also notes R hip pain and prior CSI about 2 wks ago.   Pertinent review of systems: No fevers or chills  Relevant historical information: Right hip osteoarthritis [facility].   Exam:  BP 114/80   Pulse 61   Ht 6' 0.5" (1.842 m)   Wt 226 lb (102.5 kg)   SpO2 98%   BMI 30.23 kg/m  General: Well Developed, well nourished, and in no acute distress.   MSK: L-spine: Normal appearing Nontender palpation midline. Tender palpation right lumbar paraspinal musculature. Decreased lumbar motion. Lower extremity strength is intact.    Lab and Radiology Results  X-ray images lumbar spine obtained today personally and independently interpreted. No acute fractures.  Mild multilevel degenerative changes. Await formal radiology review   Assessment and Plan: 73 y.o. male with right low back pain.  This is an acute pain ongoing for about a week.  Pain due to muscle spasm and dysfunction.  Plan for home exercise program followed by physical therapy if needed.  We could use [facility] in the future if needed for physical therapy.  Continue TENS unit and heating pad.  Did prescribe tizanidine to use as needed as well.   PDMP not reviewed this encounter. Orders Placed This Encounter   Procedures   DG Lumbar Spine 2-3 Views    Standing Status:   Future    Number of Occurrences:   1    Expiration Date:   01/23/2024    Reason for Exam (SYMPTOM  OR DIAGNOSIS REQUIRED):   low back pain    Preferred imaging location?:   [facility]   Meds ordered this encounter  Medications   tiZANidine (ZANAFLEX) 2 MG tablet    Sig: Take 1 tablet (2 mg total) by mouth at bedtime.    Dispense:  30 tablet    Refill:  0     Discussed warning signs or symptoms. Please see discharge instructions. Patient expresses understanding.   The above documentation has been reviewed and is accurate and complete Clementeen Graham, M.D.

## 2023-12-26 ENCOUNTER — Encounter: Payer: Self-pay | Admitting: Gastroenterology

## 2024-01-01 ENCOUNTER — Encounter: Payer: Self-pay | Admitting: Family Medicine

## 2024-02-12 ENCOUNTER — Ambulatory Visit (AMBULATORY_SURGERY_CENTER): Payer: Medicare Other

## 2024-02-12 VITALS — Ht 72.5 in | Wt 219.0 lb

## 2024-02-12 DIAGNOSIS — Z860100 Personal history of colon polyps, unspecified: Secondary | ICD-10-CM

## 2024-02-12 NOTE — Progress Notes (Signed)
 No egg or soy allergy known to patient  No issues known to pt with past sedation with any surgeries or procedures Patient denies ever being told they had issues or difficulty with intubation  No FH of Malignant Hyperthermia Pt is not on diet pills Pt is not on  home 02  Pt is not on blood thinners  Pt denies issues with constipation  No A fib or A flutter Have any cardiac testing pending--no  LOA: independent Prep: spilt dose miralax   Patient's chart reviewed by Cathlyn Parsons CNRA prior to previsit and patient appropriate for the [facility].  Previsit completed and red dot placed by patient's name on their procedure day (on provider's schedule).     PV completed with patient. Prep instructions sent via mychart and home address.

## 2024-02-12 NOTE — Patient Instructions (Signed)
 [facility] has implemented a new process for scheduling procedures.  Please note your arrival time for the [facility] is your appointment time that is shown on your written instructions.  Please do not arrive one hour prior to the time listed in your instructions.  Please ignore any outside notifications to arrive one hour early.  We apologize for any confusion and look forward to seeing you for your procedure.

## 2024-02-18 DIAGNOSIS — H2513 Age-related nuclear cataract, bilateral: Secondary | ICD-10-CM | POA: Diagnosis not present

## 2024-02-18 DIAGNOSIS — H3550 Unspecified hereditary retinal dystrophy: Secondary | ICD-10-CM | POA: Diagnosis not present

## 2024-02-18 DIAGNOSIS — H18413 Arcus senilis, bilateral: Secondary | ICD-10-CM | POA: Diagnosis not present

## 2024-02-18 DIAGNOSIS — H25043 Posterior subcapsular polar age-related cataract, bilateral: Secondary | ICD-10-CM | POA: Diagnosis not present

## 2024-02-18 DIAGNOSIS — H2511 Age-related nuclear cataract, right eye: Secondary | ICD-10-CM | POA: Diagnosis not present

## 2024-02-18 DIAGNOSIS — H25013 Cortical age-related cataract, bilateral: Secondary | ICD-10-CM | POA: Diagnosis not present

## 2024-02-21 ENCOUNTER — Encounter: Payer: Self-pay | Admitting: Gastroenterology

## 2024-02-23 ENCOUNTER — Encounter: Payer: Self-pay | Admitting: Certified Registered Nurse Anesthetist

## 2024-02-26 ENCOUNTER — Encounter: Payer: Self-pay | Admitting: Gastroenterology

## 2024-02-26 ENCOUNTER — Ambulatory Visit: Payer: Medicare Other | Admitting: Gastroenterology

## 2024-02-26 VITALS — BP 118/72 | HR 63 | Temp 97.2°F | Resp 13 | Ht 72.0 in | Wt 219.0 lb

## 2024-02-26 DIAGNOSIS — K5730 Diverticulosis of large intestine without perforation or abscess without bleeding: Secondary | ICD-10-CM | POA: Diagnosis not present

## 2024-02-26 DIAGNOSIS — K635 Polyp of colon: Secondary | ICD-10-CM

## 2024-02-26 DIAGNOSIS — Z860101 Personal history of adenomatous and serrated colon polyps: Secondary | ICD-10-CM

## 2024-02-26 DIAGNOSIS — K641 Second degree hemorrhoids: Secondary | ICD-10-CM

## 2024-02-26 DIAGNOSIS — K6289 Other specified diseases of anus and rectum: Secondary | ICD-10-CM | POA: Diagnosis not present

## 2024-02-26 DIAGNOSIS — D128 Benign neoplasm of rectum: Secondary | ICD-10-CM | POA: Diagnosis not present

## 2024-02-26 DIAGNOSIS — Z860100 Personal history of colon polyps, unspecified: Secondary | ICD-10-CM

## 2024-02-26 DIAGNOSIS — Z1211 Encounter for screening for malignant neoplasm of colon: Secondary | ICD-10-CM

## 2024-02-26 DIAGNOSIS — D122 Benign neoplasm of ascending colon: Secondary | ICD-10-CM

## 2024-02-26 DIAGNOSIS — D123 Benign neoplasm of transverse colon: Secondary | ICD-10-CM | POA: Diagnosis not present

## 2024-02-26 MED ORDER — SODIUM CHLORIDE 0.9 % IV SOLN: 500.0000 mL | Freq: Once | INTRAVENOUS | Status: DC

## 2024-02-26 NOTE — Patient Instructions (Addendum)
 High fiber diet. Use FiberCon 1-2 tablets PO daily. Continue present medications. Await pathology results. Repeat colonoscopy in 3/5/7 years for surveillance based on pathology results and patient age and medical issues at the time.  Please read over handouts provided                                                    YOU HAD AN ENDOSCOPIC PROCEDURE TODAY AT THE [facility]:   Refer to the procedure report that was given to you for any specific questions about what was found during the examination.  If the procedure report does not answer your questions, please call your gastroenterologist to clarify.  If you requested that your care partner not be given the details of your procedure findings, then the procedure report has been included in a sealed envelope for you to review at your convenience later.  YOU SHOULD EXPECT: Some feelings of bloating in the abdomen. Passage of more gas than usual.  Walking can help get rid of the air that was put into your GI tract during the procedure and reduce the bloating. If you had a lower endoscopy (such as a colonoscopy or flexible sigmoidoscopy) you may notice spotting of blood in your stool or on the toilet paper. If you underwent a bowel prep for your procedure, you may not have a normal bowel movement for a few days.  Please Note:  You might notice some irritation and congestion in your nose or some drainage.  This is from the oxygen used during your procedure.  There is no need for concern and it should clear up in a day or so.  SYMPTOMS TO REPORT IMMEDIATELY:  Following lower endoscopy (colonoscopy or flexible sigmoidoscopy):  Excessive amounts of blood in the stool  Significant tenderness or worsening of abdominal pains  Swelling of the abdomen that is new, acute  Fever of 100F or higher  For urgent or emergent issues, a gastroenterologist can be reached at any hour by calling [phone]. Do not use MyChart messaging for urgent  concerns.    DIET:  We do recommend a small meal at first, but then you may proceed to your regular diet.  Drink plenty of fluids but you should avoid alcoholic beverages for 24 hours.  ACTIVITY:  You should plan to take it easy for the rest of today and you should NOT DRIVE or use heavy machinery until tomorrow (because of the sedation medicines used during the test).    FOLLOW UP: Our staff will call the number listed on your records the next business day following your procedure.  We will call around 7:15- 8:00 am to check on you and address any questions or concerns that you may have regarding the information given to you following your procedure. If we do not reach you, we will leave a message.     If any biopsies were taken you will be contacted by phone or by letter within the next 1-3 weeks.  Please call us at [phone] if you have not heard about the biopsies in 3 weeks.    SIGNATURES/CONFIDENTIALITY: You and/or your care partner have signed paperwork which will be entered into your electronic medical record.  These signatures attest to the fact that that the information above on your After Visit Summary has been reviewed and is  understood.  Full responsibility of the confidentiality of this discharge information lies with you and/or your care-partner.

## 2024-02-26 NOTE — Progress Notes (Signed)
 GASTROENTEROLOGY PROCEDURE H&P NOTE   Primary Care Physician: Joselyn Arrow, MD  HPI: Dillon Caldwell is a 73 y.o. male who presents for Colonoscopy for surveillance of prior adenomas.  Past Medical History:  Diagnosis Date   BPH (benign prostatic hyperplasia)    Erectile dysfunction    previously took Cialis   Essential hypertension, benign    diagnosed age 28; off meds 08/2012   Hypogonadism male    testosterone level 186 on 09/2010   Impaired fasting glucose    A1c 6.1 and fglu 127 01/2011   Macular degeneration, dry 2011   followed by retinal specialist (dry)   Tachycardia    likely h/o SVT, treated with ablation    Past Surgical History:  Procedure Laterality Date   ABLATION OF DYSRHYTHMIC FOCUS  2006   ?PSVT by history   ANKLE SURGERY     both ankles with bone spurs (related to [place])   ARTHROSCOPIC REPAIR ACL Left 2001   L knee   FOOT SURGERY Bilateral    bone spurs removed from both ankles in the 70's (different years, similar surgery bilaterally)   KNEE ARTHROSCOPY Left 08/02/2021   Dr. Salvatore Marvel   ROTATOR CUFF REPAIR Right 04/08/2015   ROTATOR CUFF REPAIR Left 01/2018   supraspinatous (repaired in [place])   TONSILLECTOMY  child   TRIGGER FINGER RELEASE Left 03/22/2022   Dr. Dion Saucier   VASECTOMY     Current Outpatient Medications  Medication Sig Dispense Refill   losartan (COZAAR) 25 MG tablet Take 1 tablet (25 mg total) by mouth daily. 90 tablet 1   MELATONIN ER PO Take 1 each by mouth at bedtime.     NON FORMULARY Take 1 each by mouth at bedtime.     tadalafil (CIALIS) 20 MG tablet Take 0.5-1 tablets (10-20 mg total) by mouth daily as needed for erectile dysfunction. 15 tablet 1   tiZANidine (ZANAFLEX) 2 MG tablet Take 1 tablet (2 mg total) by mouth at bedtime. (Patient not taking: Reported on 02/12/2024) 30 tablet 0   No current facility-administered medications for this visit.    Current Outpatient Medications:    losartan (COZAAR) 25 MG tablet,  Take 1 tablet (25 mg total) by mouth daily., Disp: 90 tablet, Rfl: 1   MELATONIN ER PO, Take 1 each by mouth at bedtime., Disp: , Rfl:    NON FORMULARY, Take 1 each by mouth at bedtime., Disp: , Rfl:    tadalafil (CIALIS) 20 MG tablet, Take 0.5-1 tablets (10-20 mg total) by mouth daily as needed for erectile dysfunction., Disp: 15 tablet, Rfl: 1   tiZANidine (ZANAFLEX) 2 MG tablet, Take 1 tablet (2 mg total) by mouth at bedtime. (Patient not taking: Reported on 02/12/2024), Disp: 30 tablet, Rfl: 0 No Known Allergies Family History  Problem Relation Age of Onset   Diabetes Mother    Heart disease Mother        angioplasty in her 64's   Pulmonary fibrosis Mother        possibly misdiagnosed   Hypertension Mother    Macular degeneration Mother    Thyroid disease Mother        had thyroidectomy (unclear diagnosis for why)   Hashimoto's thyroiditis Mother    Single kidney Father    Hashimoto's thyroiditis Brother    Stroke Brother 29   Brain cancer Brother 68       glioblastoma   Cancer Maternal Grandmother 76       pancreatic cancer  Pancreatic cancer Maternal Grandmother    Single kidney Paternal Grandfather    Crohn's disease Son        dx'd at age 4 months   Macular degeneration Maternal Aunt    Diabetes Maternal Aunt    Macular degeneration Maternal Aunt    Diabetes Maternal Aunt    Macular degeneration Maternal Uncle    Diabetes Maternal Uncle    Cancer Maternal Uncle        prostate cancer   Single kidney Grandson    Social History   Socioeconomic History   Marital status: Married    Spouse name: Not on file   Number of children: 2   Years of education: Not on file   Highest education level: Not on file  Occupational History   Occupation: retired Runner, broadcasting/film/video  Tobacco Use   Smoking status: Never   Smokeless tobacco: Never  Vaping Use   Vaping status: Never Used  Substance and Sexual Activity   Alcohol use: Yes    Comment: 3-4/week   Drug use: Not Currently     Comment: nightly gummy with Hemp/THC/CBD   Sexual activity: Yes    Partners: Female  Other Topics Concern   Not on file  Social History Narrative   Lives with wife (moved back from [place] 04/2020, after wife retired).      Son moved to [place], [place], daughter in [place], teaching at [employer] (will be the head of school).  2 grandsons (1 with only 1 kidney)      Updated 09/2023   Social Drivers of Health   Financial Resource Strain: Not on file  Food Insecurity: Not on file  Transportation Needs: Not on file  Physical Activity: Not on file  Stress: Not on file  Social Connections: Not on file  Intimate Partner Violence: Not on file    Physical Exam: There were no vitals filed for this visit. There is no height or weight on file to calculate BMI. GEN: NAD EYE: Sclerae anicteric ENT: MMM CV: Non-tachycardic GI: Soft, NT/ND NEURO:  Alert & Oriented x 3  Lab Results: No results for input(s): "WBC", "HGB", "HCT", "PLT" in the last 72 hours. BMET No results for input(s): "NA", "K", "CL", "CO2", "GLUCOSE", "BUN", "CREATININE", "CALCIUM" in the last 72 hours. LFT No results for input(s): "PROT", "ALBUMIN", "AST", "ALT", "ALKPHOS", "BILITOT", "BILIDIR", "IBILI" in the last 72 hours. PT/INR No results for input(s): "LABPROT", "INR" in the last 72 hours.   Impression / Plan: This is a 73 y.o.male who presents for Colonoscopy for surveillance of prior adenomas.  The risks and benefits of endoscopic evaluation/treatment were discussed with the patient and/or family; these include but are not limited to the risk of perforation, infection, bleeding, missed lesions, lack of diagnosis, severe illness requiring hospitalization, as well as anesthesia and sedation related illnesses.  The patient's history has been reviewed, patient examined, no change in status, and deemed stable for procedure.  The patient and/or family is agreeable to proceed.    Corliss Parish,  MD [facility] Office # [phone]

## 2024-02-26 NOTE — Op Note (Signed)
 [facility] Patient Name: Dillon Caldwell Procedure Date: 02/26/2024 9:11 AM MRN: [id number] Endoscopist: Corliss Parish , MD, [id number] Age: 73 Referring MD:  Date of Birth: Jul 17, 1951 Gender: Male Account #: [id number] Procedure:                Colonoscopy Indications:              Surveillance: Personal history of adenomatous                            polyps on last colonoscopy > 5 years ago Medicines:                Monitored Anesthesia Care Procedure:                Pre-Anesthesia Assessment:                           - Prior to the procedure, a History and Physical                            was performed, and patient medications and                            allergies were reviewed. The patient's tolerance of                            previous anesthesia was also reviewed. The risks                            and benefits of the procedure and the sedation                            options and risks were discussed with the patient.                            All questions were answered, and informed consent                            was obtained. Prior Anticoagulants: The patient has                            taken no anticoagulant or antiplatelet agents. ASA                            Grade Assessment: II - A patient with mild systemic                            disease. After reviewing the risks and benefits,                            the patient was deemed in satisfactory condition to                            undergo the procedure.  After obtaining informed consent, the colonoscope                            was passed under direct vision. Throughout the                            procedure, the patient's blood pressure, pulse, and                            oxygen saturations were monitored continuously. The                            Olympus CF-HQ190L ([id number]) Colonoscope was                            introduced through the  anus and advanced to the the                            cecum, identified by appendiceal orifice and                            ileocecal valve. The colonoscopy was performed                            without difficulty. The patient tolerated the                            procedure. The quality of the bowel preparation was                            adequate. The ileocecal valve, appendiceal orifice,                            and rectum were photographed. Scope In: 9:18:47 AM Scope Out: 9:40:31 AM Scope Withdrawal Time: 0 hours 18 minutes 57 seconds  Total Procedure Duration: 0 hours 21 minutes 44 seconds  Findings:                 The digital rectal exam findings include                            hemorrhoids. Pertinent negatives include no                            palpable rectal lesions.                           Six sessile polyps were found in the rectum (1),                            hepatic flexure (2) and ascending colon (3). The                            polyps were 2 to 6 mm in size. These polyps were  removed with a cold snare. Resection and retrieval                            were complete.                           Multiple small-mouthed diverticula were found in                            the recto-sigmoid colon and sigmoid colon.                           Normal mucosa was found in the entire colon                            otherwise.                           Anal papillae were hypertrophied.                           Non-bleeding non-thrombosed internal hemorrhoids                            were found during retroflexion, during perianal                            exam and during digital exam. The hemorrhoids were                            Grade II (internal hemorrhoids that prolapse but                            reduce spontaneously). Complications:            No immediate complications. Estimated Blood Loss:     Estimated blood  loss was minimal. Impression:               - Hemorrhoids found on digital rectal exam.                           - Six 2 to 6 mm polyps in the rectum, at the                            hepatic flexure and in the ascending colon, removed                            with a cold snare. Resected and retrieved.                           - Diverticulosis in the recto-sigmoid colon and in                            the sigmoid colon.                           -  Normal mucosa in the entire examined colon                            otherwise.                           - Anal papillae were hypertrophied.                           - Non-bleeding non-thrombosed internal hemorrhoids. Recommendation:           - The patient will be observed post-procedure,                            until all discharge criteria are met.                           - Discharge patient to home.                           - Patient has a contact number available for                            emergencies. The signs and symptoms of potential                            delayed complications were discussed with the                            patient. Return to normal activities tomorrow.                            Written discharge instructions were provided to the                            patient.                           - High fiber diet.                           - Use FiberCon 1-2 tablets PO daily.                           - Continue present medications.                           - Await pathology results.                           - Repeat colonoscopy in 3/5/7 years for                            surveillance based on pathology results and patient                            age and medical issues at the time.                           -  The findings and recommendations were discussed                            with the patient.                           - The findings and recommendations were discussed                             with the patient's family. Corliss Parish, MD 02/26/2024 9:47:42 AM

## 2024-02-26 NOTE — Progress Notes (Signed)
 Report given to PACU, vss

## 2024-02-26 NOTE — Progress Notes (Signed)
0928 Ephedrine 10 mg given IV due to low BP, MD updated.   

## 2024-02-26 NOTE — Progress Notes (Signed)
 Called to room to assist during endoscopic procedure.  Patient ID and intended procedure confirmed with present staff. Received instructions for my participation in the procedure from the performing physician.

## 2024-02-26 NOTE — Progress Notes (Signed)
 Pt's states no medical or surgical changes since previsit or office visit.

## 2024-02-27 ENCOUNTER — Telehealth: Payer: Self-pay

## 2024-02-27 NOTE — Telephone Encounter (Signed)
 LMOM

## 2024-02-28 LAB — SURGICAL PATHOLOGY

## 2024-03-01 ENCOUNTER — Encounter: Payer: Self-pay | Admitting: Gastroenterology

## 2024-03-02 ENCOUNTER — Other Ambulatory Visit: Payer: Self-pay | Admitting: Family Medicine

## 2024-03-02 DIAGNOSIS — N529 Male erectile dysfunction, unspecified: Secondary | ICD-10-CM

## 2024-03-06 DIAGNOSIS — M1611 Unilateral primary osteoarthritis, right hip: Secondary | ICD-10-CM | POA: Diagnosis not present

## 2024-04-20 ENCOUNTER — Other Ambulatory Visit: Payer: Self-pay | Admitting: Family Medicine

## 2024-04-20 ENCOUNTER — Encounter: Payer: Self-pay | Admitting: Family Medicine

## 2024-04-20 DIAGNOSIS — G8929 Other chronic pain: Secondary | ICD-10-CM

## 2024-04-20 NOTE — Progress Notes (Unsigned)
 No chief complaint on file.   Dillon Caldwell is a 73 y.o. male who presents for annual wellness visit and follow-up on chronic medical conditions. He had his annual physical exam in December.  He has been hiking in [place] [facility])  He saw Dr. Alease Hunter in January 2025 with back pain. Xrays showed: IMPRESSION: 1. Prominent L4-L5 and L5-S1 facet hypertrophy with grade 1 anterolisthesis of L4 on L5. 2. Disc space narrowing at T11-T12.  His pain improved, worked with his Gaffer. Requested RF of tizanidine  from Dr. Alease Hunter earlier this week. ***   He gets hearing checks through [facility], but wanted to be evaluated elsewhere.  He has trouble understanding things despite the hearing aids. ***UPDATE   Hypertension:  He is compliant with taking losartan  25mg  daily. He denies side effects. BP's are running ***   His monitor was verified as accurate in 02/2022 Denies headaches, dizziness, chest pain, edema.   BP Readings from Last 3 Encounters:  02/26/24 118/72  12/23/23 114/80  10/28/23 118/74    Overweight--   He lost weight in the past using [employer] (logging food, 10K steps, daily weight).  He had gained weight in the Fall of 2024, which he related to being in [place] x 3 weeks moving his parents into assisted living. There is a diner there that "is my happy place" (omelet, 2 biscuits, potatoes)   Wt Readings from Last 3 Encounters:  02/26/24 219 lb (99.3 kg)  02/12/24 219 lb (99.3 kg)  12/23/23 226 lb (102.5 kg)  12/23/23 226 lb (102.5 kg) 10/28/23 223 lb (101.2 kg)  08/20/23 222 lb 9.6 oz (101 kg)  03/11/23 211 lb 6.4 oz (95.9 kg)    Prediabetes--A1c was 6.1% in 02/2022, when having ice cream sandwiches daily, no other sweets ([place] 200 cal). A1c was 5.8% on last check in 02/2023.  At that time, he had been hiking x 3 weeks, not having ice cream (but having almond M&M's and other foods he doesn't typically eat). He continues to have [place]  ice cream sandwiches daily when home. He continues to eat a lot of salads, and limits bread, rice, pasta. He remains physically active   Lab Results  Component Value Date   HGBA1C 5.8 (H) 03/11/2023       Insomnia:  He sometimes has trouble shutting his mind down to get to sleep.  In general, he sleeps 7 hours/night with an occasional nap.  He is having some trouble staying asleep.  He sleeps on his side, and his arms fall asleep (3rd through 5th fingers).  Does better if he sleeps on his back. Knee pain is also affecting his sleep. He continues to take CBD/Hemp gummies at bedtime about 3x/week.  He tends to get better quality sleep with it. He has been taking Advil PM most nights (used to use it just occasionally, 2-3x/month) ***     ED: He is taking cialis  20mg  up to every other day with good results.   Dyslipidemia--  He reports always having low HDL, even when very active, running marathons. He reports his lipids have been better ever since doing the long hiking. He tries to follow a lowfat, low cholesterol diet and gets regular exercise. Last lipids were good.  Due for recheck.   He denies any change to his diet, wife is mostly vegetarian.  When last checked (April 2024), he had eaten cold soaked oatmeal while hiking for a few weeks prior to that check. When home,  often has 3 egg omelets. Has hot oatmeal in the winter.  ***UPDATE diet   Lab Results  Component Value Date   CHOL 144 03/11/2023   HDL 44 03/11/2023   LDLCALC 81 03/11/2023   TRIG 104 03/11/2023   CHOLHDL 3.3 03/11/2023    H/o GERD--No longer having issues with belching and heartburn. No longer needs Prilosec.  In the past has seen ENT (Dr. Tellis Feathers) for globus sensation; sometimes a pill would get hung up in throat--this is very infrequent, and relieved by drinking water.  Food only infrequently gets hung up as well.    Immunization History  Administered Date(s) Administered   Fluad Quad(high Dose 65+) 07/30/2023    Influenza Split 09/08/2012   Influenza, High Dose Seasonal PF 12/01/2015, 07/27/2020, 08/21/2022   Influenza,inj,Quad PF,6+ Mos 09/24/2013, 08/30/2014   Moderna Sars-Covid-2 Vaccination 05/02/2021   PFIZER(Purple Top)SARS-COV-2 Vaccination 02/04/2020, 02/25/2020, 09/25/2020   PNEUMOCOCCAL CONJUGATE-20 10/28/2023   Pfizer Covid-19 Vaccine Bivalent Booster 59yrs & up 09/06/2021   Pfizer(Comirnaty)Fall Seasonal Vaccine 12 years and older 09/12/2022, 07/30/2023   Pneumococcal Conjugate-13 06/27/2016   Pneumococcal Polysaccharide-23 01/12/2021   Respiratory Syncytial Virus Vaccine,Recomb Aduvanted(Arexvy) 12/11/2022   Tdap 01/02/2012, 08/15/2022   Zoster Recombinant(Shingrix) 01/27/2021, 04/08/2021   Zoster, Live 12/03/2012   Last colonoscopy: 02/2024 with Dr. Brice Campi.  6 polyps, including adenomatous (and a hyperplastic).  3 yr f/u recommended. Last PSA: Lab Results  Component Value Date   PSA1 2.7 03/11/2023   PSA1 2.0 03/08/2022   PSA1 1.7 01/12/2021   PSA 1.69 12/22/2019   PSA 1.45 06/27/2016   PSA 1.88 12/30/2014   Dentist: Dr. Theophilus Fitz two times/year Ophtho: yearly Exercise:    hiking Walking or running 3-4 days/week for 1-2 hours at a time (cut down on frequency since knee has started bothering him, used to be daily).   Walking 5 days/week, not running as much..  Occasional [place] bike (rainy days) Pilates once a week.  He is using some resistance bands.     Patient Care Team: Roosvelt Colla, MD as PCP - General (Family Medicine) Dentist: Dr. Theta Fleck: [facility] [address], and Dr. Augustus Ledger (retinal specialist) ENT: Dr. Sherrilyn Doll: Dr. Jinger Mount, Dr. Agatha Horsfall  Plastic Surgery: Dr. Larraine Plush Audiology--through [facility] GI: Dr. Brice Campi Sports Medicine: Dr. Alease Hunter   Depression Screening: Flowsheet Row Office Visit from 10/28/2023 in [facility]  PHQ-2 Total Score 0         Falls screen:     10/28/2023    1:39 PM 03/11/2023     2:30 PM 09/12/2022    1:38 PM 03/08/2022    9:37 AM 07/06/2021    1:35 PM  Fall Risk   Falls in the past year? 0 0 0 1 0  Number falls in past yr: 0 0 0 1 0  Comment    running on both, tripped on curb. 3/23 and 11/22   Injury with Fall? 0 0 0 0 0  Comment    bloody nose   Risk for fall due to : No Fall Risks No Fall Risks No Fall Risks History of fall(s) No Fall Risks  Follow up Falls evaluation completed Falls evaluation completed Falls evaluation completed Falls evaluation completed Falls evaluation completed     Functional Status Survey:          End of Life Discussion:  Patient has a living will and medical power of attorney, scanned in chart.   PMH, PSH, SH and FH were reviewed and updated  ROS: The patient denies anorexia, fever, headaches, ear pain, hoarseness, chest pain, dizziness, syncope, dyspnea on exertion, cough, swelling, nausea, vomiting, abdominal pain, melena, hematochezia, hematuria, nocturia, hesitance or weakened stream, incontinence, dysuria, genital lesions, joint pains, numbness, tingling, weakness, tremor, suspicious skin lesions, depression, anxiety, abnormal bleeding/bruising, or enlarged lymph nodes. No reflux; rare dysphagia. +insomnia, controlled with CBD, rare advil PM.  +hearing loss--wears hearing aids. Seems to be declining. ED controlled by cialis   Joint pain? (Hip, knee, back?)     PHYSICAL EXAM:  There were no vitals taken for this visit.  Wt Readings from Last 3 Encounters:  02/26/24 219 lb (99.3 kg)  02/12/24 219 lb (99.3 kg)  12/23/23 226 lb (102.5 kg)   Pleasant male in no distress. Had some difficulty hearing, even with his current hearing aids. HEENT: conjunctiva and sclera are clear, EOMI.  OP clear, sinuses nontender Neck: no lymphadenopathy, thyromegaly or carotid bruit Heart: regular rate and rhythm Lungs: clear bilaterally Abdomen: soft, nontender Back: no spinal or CVA tenderness Extremities: no edema, 2+  pulses Neuro: alert and oriented, cranial nerves grossly intact. Normal gait. Skin: purpura noted on L forearm (multiple, up and down the forearm, nowhere else). WHSS at L forehead, minimal scar.   ***UPDATE SKIN  ASSESSMENT/PLAN:  Last year he was considering novavax (had declined getting mRNA booster). Did he ever get?  A1c c-met, lipids, PSA,  Consider CBC and ferritin, if still donating blood He last donated blood ***  Will be last PSA based on age   Discussed PSA screening (risks/benefits), recommended at least 30 minutes of aerobic activity at least 5 days/week, weight-bearing exercise 2x/week; proper sunscreen use reviewed; healthy diet and alcohol recommendations (less than or equal to 2 drinks/day) reviewed; regular seatbelt use; changing batteries in smoke detectors. Immunization recommendations discussed--continue yearly high dose flu shots.  COVID booster recommended, declined today.  He is considering getting Novavax. ***UPDATE Colonoscopy recommendations reviewed--UTD, due again 02/2027  MOST form reviewed, updated. Doesn't want prolonged measures.  Full Code, Full Care.   F/u 6 months for CPE (after 12/2), sooner prn  Medicare Attestation I have personally reviewed: The patient's medical and social history Their use of alcohol, tobacco or illicit drugs Their current medications and supplements The patient's functional ability including ADLs,fall risks, home safety risks, cognitive, and hearing and visual impairment Diet and physical activities Evidence for depression or mood disorders  The patient's weight, height, and BMI have been recorded in the chart.  I have made referrals, counseling, and provided education to the patient based on review of the above and I have provided the patient with a written personalized care plan for preventive services.

## 2024-04-20 NOTE — Patient Instructions (Incomplete)
  Dillon Caldwell , Thank you for taking time to come for your Medicare Wellness Visit. I appreciate your ongoing commitment to your health goals. Please review the following plan we discussed and let me know if I can assist you in the future.   This is a list of the screening recommended for you and due dates:  Health Maintenance  Topic Date Due   COVID-19 Vaccine (8 - 2024-25 season) 01/27/2024   Flu Shot  06/26/2024   Medicare Annual Wellness Visit  04/22/2025   Colon Cancer Screening  02/26/2027   DTaP/Tdap/Td vaccine (3 - Td or Tdap) 08/15/2032   Pneumonia Vaccine  Completed   Hepatitis C Screening  Completed   Zoster (Shingles) Vaccine  Completed   HPV Vaccine  Aged Out   Meningitis B Vaccine  Aged Out   Continue yearly high dose flu shots. COVID boosters/vaccines are recommended (every 6 months >65).  Try taking Tylenol Arthritis at bedtime, to limit pain as a factor with your sleep.

## 2024-04-21 NOTE — Telephone Encounter (Signed)
 Last OV 12/23/23 Next OV not scheduled  Last refill 12/23/23 Qty # 30/0  Patient reported not taking on 02/12/24.

## 2024-04-22 ENCOUNTER — Ambulatory Visit (INDEPENDENT_AMBULATORY_CARE_PROVIDER_SITE_OTHER): Payer: Medicare Other | Admitting: Family Medicine

## 2024-04-22 ENCOUNTER — Encounter: Payer: Self-pay | Admitting: Family Medicine

## 2024-04-22 VITALS — BP 130/80 | HR 56 | Ht 73.0 in | Wt 213.4 lb

## 2024-04-22 DIAGNOSIS — I10 Essential (primary) hypertension: Secondary | ICD-10-CM | POA: Diagnosis not present

## 2024-04-22 DIAGNOSIS — Z125 Encounter for screening for malignant neoplasm of prostate: Secondary | ICD-10-CM | POA: Diagnosis not present

## 2024-04-22 DIAGNOSIS — Z0000 Encounter for general adult medical examination without abnormal findings: Secondary | ICD-10-CM | POA: Diagnosis not present

## 2024-04-22 DIAGNOSIS — N529 Male erectile dysfunction, unspecified: Secondary | ICD-10-CM

## 2024-04-22 DIAGNOSIS — Z52008 Unspecified donor, other blood: Secondary | ICD-10-CM

## 2024-04-22 DIAGNOSIS — R7303 Prediabetes: Secondary | ICD-10-CM | POA: Diagnosis not present

## 2024-04-22 DIAGNOSIS — E785 Hyperlipidemia, unspecified: Secondary | ICD-10-CM | POA: Diagnosis not present

## 2024-04-22 DIAGNOSIS — G479 Sleep disorder, unspecified: Secondary | ICD-10-CM

## 2024-04-22 LAB — POCT GLYCOSYLATED HEMOGLOBIN (HGB A1C): Hemoglobin A1C: 6.1 % — AB (ref 4.0–5.6)

## 2024-04-22 MED ORDER — LOSARTAN POTASSIUM 25 MG PO TABS: 25.0000 mg | ORAL_TABLET | Freq: Every day | ORAL | 1 refills | Status: DC

## 2024-04-22 MED ORDER — TADALAFIL 20 MG PO TABS
10.0000 mg | ORAL_TABLET | Freq: Every day | ORAL | 0 refills | Status: DC | PRN
Start: 2024-04-22 — End: 2024-05-25

## 2024-04-23 ENCOUNTER — Ambulatory Visit: Payer: Self-pay | Admitting: Family Medicine

## 2024-04-23 LAB — LIPID PANEL
Chol/HDL Ratio: 4.3 ratio (ref 0.0–5.0)
Cholesterol, Total: 199 mg/dL (ref 100–199)
HDL: 46 mg/dL (ref >39)
LDL Chol Calc (NIH): 138 mg/dL — ABNORMAL HIGH (ref 0–99)
Triglycerides: 85 mg/dL (ref 0–149)
VLDL Cholesterol Cal: 15 mg/dL (ref 5–40)

## 2024-04-23 LAB — COMPREHENSIVE METABOLIC PANEL WITH GFR
ALT: 17 IU/L (ref 0–44)
AST: 21 IU/L (ref 0–40)
Albumin: 4.3 g/dL (ref 3.8–4.8)
Alkaline Phosphatase: 111 IU/L (ref 44–121)
BUN/Creatinine Ratio: 19 (ref 10–24)
BUN: 20 mg/dL (ref 8–27)
Bilirubin Total: 0.4 mg/dL (ref 0.0–1.2)
CO2: 22 mmol/L (ref 20–29)
Calcium: 8.9 mg/dL (ref 8.6–10.2)
Chloride: 103 mmol/L (ref 96–106)
Creatinine, Ser: 1.04 mg/dL (ref 0.76–1.27)
Globulin, Total: 2.4 g/dL (ref 1.5–4.5)
Glucose: 93 mg/dL (ref 70–99)
Potassium: 4.5 mmol/L (ref 3.5–5.2)
Sodium: 141 mmol/L (ref 134–144)
Total Protein: 6.7 g/dL (ref 6.0–8.5)
eGFR: 75 mL/min/{1.73_m2} (ref >59)

## 2024-04-23 LAB — PSA: Prostate Specific Ag, Serum: 2.3 ng/mL (ref 0.0–4.0)

## 2024-04-24 DIAGNOSIS — M1611 Unilateral primary osteoarthritis, right hip: Secondary | ICD-10-CM | POA: Diagnosis not present

## 2024-04-26 HISTORY — PX: CATARACT EXTRACTION, BILATERAL: SHX1313

## 2024-04-27 DIAGNOSIS — H2511 Age-related nuclear cataract, right eye: Secondary | ICD-10-CM | POA: Diagnosis not present

## 2024-04-28 DIAGNOSIS — H25012 Cortical age-related cataract, left eye: Secondary | ICD-10-CM | POA: Diagnosis not present

## 2024-04-28 DIAGNOSIS — H25042 Posterior subcapsular polar age-related cataract, left eye: Secondary | ICD-10-CM | POA: Diagnosis not present

## 2024-04-28 DIAGNOSIS — H2512 Age-related nuclear cataract, left eye: Secondary | ICD-10-CM | POA: Diagnosis not present

## 2024-05-24 ENCOUNTER — Other Ambulatory Visit: Payer: Self-pay | Admitting: Family Medicine

## 2024-05-24 DIAGNOSIS — N529 Male erectile dysfunction, unspecified: Secondary | ICD-10-CM

## 2024-05-25 DIAGNOSIS — H2512 Age-related nuclear cataract, left eye: Secondary | ICD-10-CM | POA: Diagnosis not present

## 2024-05-25 DIAGNOSIS — H25042 Posterior subcapsular polar age-related cataract, left eye: Secondary | ICD-10-CM | POA: Diagnosis not present

## 2024-05-25 DIAGNOSIS — H25012 Cortical age-related cataract, left eye: Secondary | ICD-10-CM | POA: Diagnosis not present

## 2024-06-29 DIAGNOSIS — M1611 Unilateral primary osteoarthritis, right hip: Secondary | ICD-10-CM | POA: Diagnosis not present

## 2024-06-29 DIAGNOSIS — M25551 Pain in right hip: Secondary | ICD-10-CM | POA: Diagnosis not present

## 2024-07-16 DIAGNOSIS — M1611 Unilateral primary osteoarthritis, right hip: Secondary | ICD-10-CM | POA: Diagnosis not present

## 2024-07-16 HISTORY — PX: TOTAL HIP ARTHROPLASTY: SHX124

## 2024-07-20 DIAGNOSIS — M1611 Unilateral primary osteoarthritis, right hip: Secondary | ICD-10-CM | POA: Diagnosis not present

## 2024-07-20 DIAGNOSIS — M6281 Muscle weakness (generalized): Secondary | ICD-10-CM | POA: Diagnosis not present

## 2024-07-20 DIAGNOSIS — R262 Difficulty in walking, not elsewhere classified: Secondary | ICD-10-CM | POA: Diagnosis not present

## 2024-07-28 DIAGNOSIS — M6281 Muscle weakness (generalized): Secondary | ICD-10-CM | POA: Diagnosis not present

## 2024-07-28 DIAGNOSIS — M1611 Unilateral primary osteoarthritis, right hip: Secondary | ICD-10-CM | POA: Diagnosis not present

## 2024-07-28 DIAGNOSIS — R262 Difficulty in walking, not elsewhere classified: Secondary | ICD-10-CM | POA: Diagnosis not present

## 2024-07-29 DIAGNOSIS — M1611 Unilateral primary osteoarthritis, right hip: Secondary | ICD-10-CM | POA: Diagnosis not present

## 2024-08-03 DIAGNOSIS — M1611 Unilateral primary osteoarthritis, right hip: Secondary | ICD-10-CM | POA: Diagnosis not present

## 2024-08-03 DIAGNOSIS — R262 Difficulty in walking, not elsewhere classified: Secondary | ICD-10-CM | POA: Diagnosis not present

## 2024-08-03 DIAGNOSIS — M6281 Muscle weakness (generalized): Secondary | ICD-10-CM | POA: Diagnosis not present

## 2024-08-10 DIAGNOSIS — M6281 Muscle weakness (generalized): Secondary | ICD-10-CM | POA: Diagnosis not present

## 2024-08-10 DIAGNOSIS — R262 Difficulty in walking, not elsewhere classified: Secondary | ICD-10-CM | POA: Diagnosis not present

## 2024-08-10 DIAGNOSIS — M1611 Unilateral primary osteoarthritis, right hip: Secondary | ICD-10-CM | POA: Diagnosis not present

## 2024-08-17 DIAGNOSIS — R262 Difficulty in walking, not elsewhere classified: Secondary | ICD-10-CM | POA: Diagnosis not present

## 2024-08-17 DIAGNOSIS — M1611 Unilateral primary osteoarthritis, right hip: Secondary | ICD-10-CM | POA: Diagnosis not present

## 2024-08-17 DIAGNOSIS — M6281 Muscle weakness (generalized): Secondary | ICD-10-CM | POA: Diagnosis not present

## 2024-08-24 DIAGNOSIS — R262 Difficulty in walking, not elsewhere classified: Secondary | ICD-10-CM | POA: Diagnosis not present

## 2024-08-24 DIAGNOSIS — M1611 Unilateral primary osteoarthritis, right hip: Secondary | ICD-10-CM | POA: Diagnosis not present

## 2024-08-24 DIAGNOSIS — M6281 Muscle weakness (generalized): Secondary | ICD-10-CM | POA: Diagnosis not present

## 2024-08-26 DIAGNOSIS — M1611 Unilateral primary osteoarthritis, right hip: Secondary | ICD-10-CM | POA: Diagnosis not present

## 2024-09-08 ENCOUNTER — Encounter (INDEPENDENT_AMBULATORY_CARE_PROVIDER_SITE_OTHER): Payer: Medicare Other | Admitting: Ophthalmology

## 2024-09-08 DIAGNOSIS — H353132 Nonexudative age-related macular degeneration, bilateral, intermediate dry stage: Secondary | ICD-10-CM

## 2024-09-08 DIAGNOSIS — H35033 Hypertensive retinopathy, bilateral: Secondary | ICD-10-CM

## 2024-09-08 DIAGNOSIS — I10 Essential (primary) hypertension: Secondary | ICD-10-CM | POA: Diagnosis not present

## 2024-09-08 DIAGNOSIS — H3553 Other dystrophies primarily involving the sensory retina: Secondary | ICD-10-CM | POA: Diagnosis not present

## 2024-09-08 DIAGNOSIS — H43813 Vitreous degeneration, bilateral: Secondary | ICD-10-CM

## 2024-11-15 NOTE — Progress Notes (Unsigned)
 " No chief complaint on file.   Dillon Caldwell is a 73 y.o. male who presents annual exam and follow-up on chronic medical conditions.  He had AWV in May 2025.  Since his last visit here, he underwent R total hip replacement. ***UPDATE  Hip pain? Knee pain?  Hypertension:  He is compliant with taking losartan  25mg  daily. He denies side effects. BP's haven't been checked in a while, had all been very good, without variation.   His monitor was verified as accurate in 02/2022 Denies headaches, dizziness, chest pain, edema.   BP Readings from Last 3 Encounters:  04/22/24 130/80  02/26/24 118/72  12/23/23 114/80   Overweight-- He lost weight in the past using [employer] (logging food, 10K steps, daily weight).   He has lost weight again by eating more salads, not much meat, skips lunch unless he is hungry, but having a little larger breakfast. He pays attention to the calories, but doesn't record/track.  Wt Readings from Last 3 Encounters:  04/22/24 213 lb 6.4 oz (96.8 kg)  02/26/24 219 lb (99.3 kg)  02/12/24 219 lb (99.3 kg)   02/12/24 219 lb (99.3 kg)  01/27/25226 lb (102.5 kg) 10/28/23 223 lb (101.2 kg)  08/20/23 222 lb 9.6 oz (101 kg)  03/11/23 211 lb 6.4 oz (95.9 kg)      Prediabetes--A1c was 6.1% in 03/2024, higher than prior value, which he had attributed to poor diet while backpacking (more peanut and almond M&Ms). He continues to eat a lot of salads, and limits bread, rice, pasta. He remains physically active   Dyslipidemia--  He reports always having low HDL, even when very active, running marathons.  He tries to follow a lowfat, low cholesterol diet and gets regular exercise.    LDL was much higher on last check--at that time he reported having 21 eggs/week (3 egg omelets daily). LDL was lower when having more egg whiles, rather than full eggs.  Current diet includes ***UPDATE  Component Ref Range & Units (hover) 6 mo ago 1 yr ago 2 yr ago 3 yr ago 8 yr ago 9 yr ago  10 yr ago  Cholesterol, Total 199 144 159 185     Triglycerides 85 104 69 77 99 R 83 R 135 R  HDL 46 44 49 47 47 R 38 Low  38 Low   VLDL Cholesterol Cal 15 19 13 14      LDL Chol Calc (NIH) 138 High  81 97 124 High      Chol/HDL Ratio 4.3 3.3 CM 3.2 CM 3.9 CM 3.9 R 4.7 R 5.2 R      Insomnia:  Sleep has been poor.  He has some pain in his right knee, ankle, and right hip.  He falls asleep okay, but wakes up about 5 hours later and has trouble getting back to sleep.  Uses CBD/Hemp gummies most nights, which helps him fall asleep (didn't last night and was up until midnight). Usually voids once a night. Can't always turn off the mind/brain, contributing to sleep troubles. Moods overall are good. Some stress related to parents.   Switched from beer to Hemp beverages with 5 mg with THC (50 calories). He has this 2-3x/week (wide fluctuations)..   ED: He is taking cialis  20mg  about every 3 days with good results.    H/o GERD--No longer having issues with belching and heartburn. No longer needs Prilosec.  In the past has seen ENT (Dr. Carlie) for globus sensation; sometimes a pill would  get hung up in throat--this is very infrequent, and relieved by drinking water.  Food only infrequently gets hung up as well.  Immunization History  Administered Date(s) Administered   Fluad Quad(high Dose 65+) 07/30/2023   INFLUENZA, HIGH DOSE SEASONAL PF 12/01/2015, 07/27/2020, 08/21/2022, 09/04/2024   Influenza Split 09/08/2012   Influenza,inj,Quad PF,6+ Mos 09/24/2013, 08/30/2014   Moderna Sars-Covid-2 Vaccination 05/02/2021   PFIZER(Purple Top)SARS-COV-2 Vaccination 02/04/2020, 02/25/2020, 09/25/2020   PNEUMOCOCCAL CONJUGATE-20 10/28/2023   Pfizer Covid-19 Vaccine Bivalent Booster 39yrs & up 09/06/2021   Pfizer(Comirnaty)Fall Seasonal Vaccine 12 years and older 09/12/2022, 07/30/2023, 09/04/2024   Pneumococcal Conjugate-13 06/27/2016   Pneumococcal Polysaccharide-23 01/12/2021   Respiratory Syncytial  Virus Vaccine,Recomb Aduvanted(Arexvy) 12/11/2022   Tdap 01/02/2012, 08/15/2022   Zoster Recombinant(Shingrix) 01/27/2021, 04/08/2021   Zoster, Live 12/03/2012   Last colonoscopy: 02/2024, tubular adenomas and hyperplastic polyp. 3 yr f/u recommended Last PSA: Lab Results  Component Value Date   PSA1 2.3 04/22/2024   PSA1 2.7 03/11/2023   PSA1 2.0 03/08/2022   PSA 1.69 12/22/2019   PSA 1.45 06/27/2016   PSA 1.88 12/30/2014   Dentist: Dr. Reynold two times/year Ophtho: yearly Exercise:    Walking 5 days/week, not running as much.. Occasional [place] bike (rainy days); continues to do Pilates once a week.  He is using some resistance bands.    PMH, PSH, SH and FH were reviewed and     ROS: The patient denies anorexia, fever, headaches, ear pain, hoarseness, chest pain, dizziness, syncope, dyspnea on exertion, cough, swelling, nausea, vomiting, abdominal pain, constipation, diarrhea, melena, hematochezia, hematuria, nocturia, hesitance or weakened stream, incontinence, dysuria, genital lesions, joint pains, numbness, tingling, weakness, tremor, suspicious skin lesions, depression, anxiety, abnormal bleeding/bruising, or enlarged lymph nodes. +insomnia, controlled with OTC, per HPI +hearing loss--wears hearing aids.  Tinnitus bilaterally (chronic) Declining vision--cataracts and macular degeneratoin. Planning for cataract surgery next year. *** R knee pain? Hip pain? Constipation?   PHYSICAL EXAM:  There were no vitals taken for this visit.  Wt Readings from Last 3 Encounters:  04/22/24 213 lb 6.4 oz (96.8 kg)  02/26/24 219 lb (99.3 kg)  02/12/24 219 lb (99.3 kg)   General Appearance:   Alert, cooperative, no distress, appears stated age    Head:   Normocephalic, without obvious abnormality, atraumatic    Eyes:   PERRL, conjunctiva/corneas clear, EOM's intact, fundi   benign    Ears:   Wearing hearing aids bilaterally; TM's and EACs are normal upon their removal   Nose:    No drainage or sinus tenderness   Throat:   Normal mucosa, no lesions  Neck:   Supple, no lymphadenopathy; thyroid : no enlargement/ tenderness/nodules; no carotid bruit or JVD    Back:   Spine nontender, no curvature, ROM normal, no CVA tenderness    Lungs:   Clear to auscultation bilaterally without wheezes, rales or ronchi; respirations unlabored    Chest Wall:   No tenderness or deformity    Heart:   Bradycardic, regular rhythm, S1 and S2 normal, no murmur, rub   or gallop    Breast Exam:   No chest wall tenderness, masses or gynecomastia    Abdomen:   Soft, non-tender, nondistended, normoactive bowel sounds,   no masses, no hepatosplenomegaly. +abdominal obesity   Genitalia:   Normal male external genitalia without lesions. Testicles without masses. No inguinal hernias.    Rectal:   Normal sphincter tone, no masses or tenderness; guaiac negative stool. Prostate smooth, no nodules, not enlarged.   Extremities:  No clubbing, cyanosis or edema.   Pulses:   2+ and symmetric all extremities    Skin:   Skin color, texture, turgor normal. Purpura noted on L forearm  Lymph nodes:  Cervical, supraclavicular, and inguinal nodes normal   Neurologic:   Normal strength, sensation and gait; reflexes 2+ and symmetric throughout                 Psych:  Normal mood, affect, hygiene and grooming  ***swelling below R patella?? Prev 2x2cm  ASSESSMENT/PLAN:  Does need lipids today, and fasting glucose [employer] only pays for A1c once a year--can't have today (unless willing to pay OOP if not covered)  No documentation of his hip replacement.  R hip--Dr. Delvin.  Date???  Please enter under surgical history. Did he have BOTH, or just right? Nothing in chart   Recommended at least 30 minutes of aerobic activity at least 5 days/week, weight-bearing exercise 2x/week; proper sunscreen use reviewed; healthy diet and alcohol recommendations (less than or equal to 2 drinks/day) reviewed; regular seatbelt  use; changing batteries in smoke detectors. Immunization recommendations discussed--continue yearly high dose flu shots.   Colonoscopy recommendations reviewed--UTD. Colonoscopy due again 02/2027.  F/u 6 months for AWV, sooner prn.   "

## 2024-11-15 NOTE — Patient Instructions (Signed)

## 2024-11-16 ENCOUNTER — Encounter: Payer: Self-pay | Admitting: *Deleted

## 2024-11-16 ENCOUNTER — Encounter: Payer: Self-pay | Admitting: Family Medicine

## 2024-11-16 ENCOUNTER — Ambulatory Visit: Payer: Self-pay | Admitting: Family Medicine

## 2024-11-16 VITALS — BP 114/80 | HR 52 | Ht 73.0 in | Wt 227.4 lb

## 2024-11-16 DIAGNOSIS — E785 Hyperlipidemia, unspecified: Secondary | ICD-10-CM

## 2024-11-16 DIAGNOSIS — I10 Essential (primary) hypertension: Secondary | ICD-10-CM | POA: Diagnosis not present

## 2024-11-16 DIAGNOSIS — N529 Male erectile dysfunction, unspecified: Secondary | ICD-10-CM | POA: Diagnosis not present

## 2024-11-16 DIAGNOSIS — Z0000 Encounter for general adult medical examination without abnormal findings: Secondary | ICD-10-CM | POA: Diagnosis not present

## 2024-11-16 DIAGNOSIS — R7303 Prediabetes: Secondary | ICD-10-CM

## 2024-11-16 LAB — LIPID PANEL
Chol/HDL Ratio: 4.7 ratio (ref 0.0–5.0)
Cholesterol, Total: 179 mg/dL (ref 100–199)
HDL: 38 mg/dL — ABNORMAL LOW
LDL Chol Calc (NIH): 127 mg/dL — ABNORMAL HIGH (ref 0–99)
Triglycerides: 75 mg/dL (ref 0–149)
VLDL Cholesterol Cal: 14 mg/dL (ref 5–40)

## 2024-11-16 LAB — GLUCOSE, RANDOM: Glucose: 109 mg/dL — ABNORMAL HIGH (ref 70–99)

## 2024-11-16 MED ORDER — LOSARTAN POTASSIUM 25 MG PO TABS: 25.0000 mg | ORAL_TABLET | Freq: Every day | ORAL | 1 refills | Status: AC

## 2024-11-17 ENCOUNTER — Ambulatory Visit: Payer: Self-pay | Admitting: Family Medicine

## 2024-12-24 ENCOUNTER — Encounter: Payer: Self-pay | Admitting: Family Medicine

## 2024-12-27 ENCOUNTER — Encounter: Payer: Self-pay | Admitting: *Deleted

## 2024-12-28 ENCOUNTER — Telehealth: Admitting: Family Medicine

## 2024-12-28 ENCOUNTER — Encounter: Payer: Self-pay | Admitting: Family Medicine

## 2024-12-28 VITALS — Ht 73.0 in

## 2024-12-28 DIAGNOSIS — R052 Subacute cough: Secondary | ICD-10-CM

## 2024-12-28 NOTE — Patient Instructions (Addendum)
 Stay well hydrated. We discussed switching to a 12 hour form of guaifenesin. Options include Mucinex DM 12 hour vs plain Mucinex and a separate Delsym syrup (12 hour liquid form of the cough suppressant, mainly to use at night).  Let us  know if you develop any fever, chills, sinus pain, or any yellow-green mucus or phlegm, as these can indicate a bacterial infection and we would need to prescribe an antibiotic.

## 2025-05-06 ENCOUNTER — Ambulatory Visit: Admitting: Family Medicine

## 2025-09-07 ENCOUNTER — Encounter (INDEPENDENT_AMBULATORY_CARE_PROVIDER_SITE_OTHER): Admitting: Ophthalmology

## 2025-11-29 ENCOUNTER — Encounter: Admitting: Family Medicine
